# Patient Record
Sex: Male | Born: 1965 | Race: White | Hispanic: No | Marital: Single | Smoking: Former smoker
Health system: Southern US, Community
[De-identification: ages and names within clinical notes are randomized; demographics above are authoritative.]

## PROBLEM LIST (undated history)

## (undated) DIAGNOSIS — E119 Type 2 diabetes mellitus without complications: Secondary | ICD-10-CM

## (undated) DIAGNOSIS — M549 Dorsalgia, unspecified: Secondary | ICD-10-CM

## (undated) DIAGNOSIS — I10 Essential (primary) hypertension: Secondary | ICD-10-CM

## (undated) HISTORY — DX: Dorsalgia, unspecified: M54.9

---

## 2014-04-05 DIAGNOSIS — S2249XA Multiple fractures of ribs, unspecified side, initial encounter for closed fracture: Secondary | ICD-10-CM | POA: Insufficient documentation

## 2014-04-05 DIAGNOSIS — S36113A Laceration of liver, unspecified degree, initial encounter: Secondary | ICD-10-CM | POA: Insufficient documentation

## 2014-04-05 DIAGNOSIS — J939 Pneumothorax, unspecified: Secondary | ICD-10-CM | POA: Insufficient documentation

## 2014-04-05 DIAGNOSIS — F1010 Alcohol abuse, uncomplicated: Secondary | ICD-10-CM | POA: Insufficient documentation

## 2014-04-05 HISTORY — DX: Multiple fractures of ribs, unspecified side, initial encounter for closed fracture: S22.49XA

## 2021-06-27 ENCOUNTER — Ambulatory Visit
Admission: RE | Admit: 2021-06-27 | Discharge: 2021-06-27 | Disposition: A | Discharge status: Home / Self Care | Payer: Self-pay | Source: Ambulatory Visit | Attending: Counselor | Admitting: Counselor

## 2021-06-27 ENCOUNTER — Other Ambulatory Visit: Payer: Self-pay | Admitting: Counselor

## 2021-06-27 DIAGNOSIS — X58XXXA Exposure to other specified factors, initial encounter: Secondary | ICD-10-CM | POA: Insufficient documentation

## 2021-06-27 DIAGNOSIS — S6991XA Unspecified injury of right wrist, hand and finger(s), initial encounter: Secondary | ICD-10-CM

## 2022-01-20 ENCOUNTER — Emergency Department: Payer: BC Managed Care – PPO

## 2022-01-20 ENCOUNTER — Emergency Department
Admission: EM | Admit: 2022-01-20 | Discharge: 2022-01-20 | Disposition: A | Discharge status: Home / Self Care | Payer: BC Managed Care – PPO | Attending: Emergency Medicine | Admitting: Emergency Medicine

## 2022-01-20 ENCOUNTER — Other Ambulatory Visit: Payer: Self-pay

## 2022-01-20 ENCOUNTER — Encounter: Payer: Self-pay | Admitting: Intensive Care

## 2022-01-20 DIAGNOSIS — R0781 Pleurodynia: Secondary | ICD-10-CM | POA: Diagnosis not present

## 2022-01-20 DIAGNOSIS — M546 Pain in thoracic spine: Secondary | ICD-10-CM | POA: Insufficient documentation

## 2022-01-20 DIAGNOSIS — I2510 Atherosclerotic heart disease of native coronary artery without angina pectoris: Secondary | ICD-10-CM | POA: Diagnosis not present

## 2022-01-20 DIAGNOSIS — R739 Hyperglycemia, unspecified: Secondary | ICD-10-CM | POA: Insufficient documentation

## 2022-01-20 DIAGNOSIS — E1165 Type 2 diabetes mellitus with hyperglycemia: Secondary | ICD-10-CM | POA: Diagnosis not present

## 2022-01-20 DIAGNOSIS — I10 Essential (primary) hypertension: Secondary | ICD-10-CM | POA: Diagnosis not present

## 2022-01-20 DIAGNOSIS — Z79899 Other long term (current) drug therapy: Secondary | ICD-10-CM | POA: Insufficient documentation

## 2022-01-20 DIAGNOSIS — I700 Atherosclerosis of aorta: Secondary | ICD-10-CM | POA: Diagnosis not present

## 2022-01-20 DIAGNOSIS — M549 Dorsalgia, unspecified: Secondary | ICD-10-CM | POA: Diagnosis not present

## 2022-01-20 DIAGNOSIS — K429 Umbilical hernia without obstruction or gangrene: Secondary | ICD-10-CM | POA: Diagnosis not present

## 2022-01-20 DIAGNOSIS — K6289 Other specified diseases of anus and rectum: Secondary | ICD-10-CM | POA: Diagnosis not present

## 2022-01-20 DIAGNOSIS — R079 Chest pain, unspecified: Secondary | ICD-10-CM | POA: Diagnosis not present

## 2022-01-20 DIAGNOSIS — K6389 Other specified diseases of intestine: Secondary | ICD-10-CM | POA: Diagnosis not present

## 2022-01-20 DIAGNOSIS — R0789 Other chest pain: Secondary | ICD-10-CM | POA: Diagnosis not present

## 2022-01-20 LAB — BASIC METABOLIC PANEL
Anion gap: 12 (ref 5–15)
BUN: 8 mg/dL (ref 6–20)
CO2: 25 mmol/L (ref 22–32)
Calcium: 9.1 mg/dL (ref 8.9–10.3)
Chloride: 98 mmol/L (ref 98–111)
Creatinine, Ser: 0.88 mg/dL (ref 0.61–1.24)
GFR, Estimated: >60 mL/min (ref >60)
Glucose, Bld: 311 mg/dL — ABNORMAL HIGH (ref 70–99)
Potassium: 4 mmol/L (ref 3.5–5.1)
Sodium: 135 mmol/L (ref 135–145)

## 2022-01-20 LAB — CBC
HCT: 50.9 % (ref 39.0–52.0)
Hemoglobin: 17.2 g/dL — ABNORMAL HIGH (ref 13.0–17.0)
MCH: 32.1 pg (ref 26.0–34.0)
MCHC: 33.8 g/dL (ref 30.0–36.0)
MCV: 95 fL (ref 80.0–100.0)
Platelets: 255 10*3/uL (ref 150–400)
RBC: 5.36 MIL/uL (ref 4.22–5.81)
RDW: 11.9 % (ref 11.5–15.5)
WBC: 6.2 10*3/uL (ref 4.0–10.5)
nRBC: 0 % (ref 0.0–0.2)

## 2022-01-20 LAB — CBC WITH DIFFERENTIAL/PLATELET
Abs Immature Granulocytes: 0.02 10*3/uL (ref 0.00–0.07)
Basophils Absolute: 0.1 10*3/uL (ref 0.0–0.1)
Basophils Relative: 1 %
Eosinophils Absolute: 0.1 10*3/uL (ref 0.0–0.5)
Eosinophils Relative: 2 %
HCT: 49.6 % (ref 39.0–52.0)
Hemoglobin: 17 g/dL (ref 13.0–17.0)
Immature Granulocytes: 0 %
Lymphocytes Relative: 20 %
Lymphs Abs: 1.3 10*3/uL (ref 0.7–4.0)
MCH: 32.6 pg (ref 26.0–34.0)
MCHC: 34.3 g/dL (ref 30.0–36.0)
MCV: 95.2 fL (ref 80.0–100.0)
Monocytes Absolute: 0.8 10*3/uL (ref 0.1–1.0)
Monocytes Relative: 13 %
Neutro Abs: 4.1 10*3/uL (ref 1.7–7.7)
Neutrophils Relative %: 64 %
Platelets: 256 10*3/uL (ref 150–400)
RBC: 5.21 MIL/uL (ref 4.22–5.81)
RDW: 11.9 % (ref 11.5–15.5)
WBC: 6.4 10*3/uL (ref 4.0–10.5)
nRBC: 0 % (ref 0.0–0.2)

## 2022-01-20 LAB — HEPATIC FUNCTION PANEL
ALT: 35 U/L (ref 0–44)
AST: 27 U/L (ref 15–41)
Albumin: 4.2 g/dL (ref 3.5–5.0)
Alkaline Phosphatase: 55 U/L (ref 38–126)
Bilirubin, Direct: 0.2 mg/dL (ref 0.0–0.2)
Indirect Bilirubin: 0.9 mg/dL (ref 0.3–0.9)
Total Bilirubin: 1.1 mg/dL (ref 0.3–1.2)
Total Protein: 7.9 g/dL (ref 6.5–8.1)

## 2022-01-20 LAB — TROPONIN I (HIGH SENSITIVITY)
Troponin I (High Sensitivity): 6 ng/L (ref <18)
Troponin I (High Sensitivity): 7 ng/L (ref <18)

## 2022-01-20 LAB — LIPASE, BLOOD: Lipase: 53 U/L — ABNORMAL HIGH (ref 11–51)

## 2022-01-20 LAB — PROTIME-INR
INR: 1 (ref 0.8–1.2)
Prothrombin Time: 12.9 seconds (ref 11.4–15.2)

## 2022-01-20 LAB — CBG MONITORING, ED: Glucose-Capillary: 214 mg/dL — ABNORMAL HIGH (ref 70–99)

## 2022-01-20 MED ORDER — LABETALOL HCL 5 MG/ML IV SOLN
10.0000 mg | Freq: Once | INTRAVENOUS | Status: AC
Start: 2022-01-20 — End: 2022-01-20
  Administered 2022-01-20: 10 mg via INTRAVENOUS
  Filled 2022-01-20: qty 4

## 2022-01-20 MED ORDER — IOHEXOL 350 MG/ML SOLN
75.0000 mL | Freq: Once | INTRAVENOUS | Status: AC | PRN
Start: 2022-01-20 — End: 2022-01-20
  Administered 2022-01-20: 100 mL via INTRAVENOUS

## 2022-01-20 NOTE — ED Notes (Signed)
Pt states that he has been having back pain for the past couple of days that radiated to the right side of his chest. Pt describes pain as a constant stabbing pain in his chest. Pt does states it hurts when he takes a deep breath. Pt denies any significant cardiac hx. Pt is talking in complete sentences. Chest expansion symmetrical and respirations unlabored. Pt denies any SOB.

## 2022-01-20 NOTE — ED Notes (Signed)
Pt verbalized understanding of discharge instructions and follow-up care instructions. Pt advised if symptoms worsen to return to ED.  

## 2022-01-20 NOTE — Discharge Instructions (Addendum)
Your blood pressure and your blood sugar were both higher here.  The high blood pressure may be due to whitecoat syndrome.  The high blood sugar probably is not.  It is high enough that we can say that you have diabetes.  We need to work on managing this.  The first steps are diet and exercise.  I have included some information on these.  I would like you to follow-up with primary care within the week to continue to monitor your blood pressure and blood sugar.  Please continue to take and write down your blood pressures at home.  Take this information to your primary care doctor.  If you cannot get into see your assigned primary care doctor within the next week or so I would try a walk-in clinic.  [facility] has 1 and there are several others in the area.  They can begin working on your problems.  They can give you more information about diet and exercise for diabetic treatment.  The good news is that if we can get you to lose some weight likely the high blood sugars will resolve and your blood pressure will also go down.  We did not find anything in your back with the CT scan even with the bone windows.  Also the rest of the blood work looked okay.  You can try some Maalox or Mylanta to see if it helps with the belly.  Please do not hesitate to return for worse pain or high blood sugar or feeling sicker.  Follow-up with primary care as I mention.  Remember that if the area where your skin is tender to touch develops any blisters I would like you to get seen at once.  You can return here for that.  We can then give you something for shingles.  I would do it now except for that you have had that tenderness to light touch on your skin for a week and usually shingles would have shown up with some rash by this time.

## 2022-01-20 NOTE — ED Triage Notes (Signed)
Patient c/o mid back pain in spine and now radiating around to his right chest. C/o lightheadedness and burning pain after coughing. C/o burning pain on left side with no rash present but also reports feeling it slight on right side. A&O x4

## 2022-01-20 NOTE — ED Provider Notes (Signed)
[facility] Provider Note    Event Date/Time   First MD Initiated Contact with Patient 01/20/22 1624     (approximate)   History   Back Pain and Chest Pain   HPI  Colin Brooks is a 56 y.o. male who reports onset of mid back pain about a week ago it has gradually gotten worse without moving from its place in the mid spine but now its radiating through the chest into the front of the chest mostly on the right side lower rib area.  Pain is quite bad is worse with movement.  There is no distal numbness or weakness.  Patient with blood pressure of 220/108 pulse rate of 110 and a glucose over 300.  These are all new problems per his old records and his history.      Physical Exam   Triage Vital Signs: ED Triage Vitals  Enc Vitals Group     BP 01/20/22 1453 (!) 220/108     Pulse Rate 01/20/22 1453 (!) 110     Resp 01/20/22 1453 20     Temp 01/20/22 1453 99.1 F (37.3 C)     Temp Source 01/20/22 1453 Oral     SpO2 01/20/22 1453 98 %     Weight 01/20/22 1454 220 lb (99.8 kg)     Height 01/20/22 1454 5\' 9"  (1.753 m)     Head Circumference --      Peak Flow --      Pain Score 01/20/22 1453 9     Pain Loc --      Pain Edu? --      Excl. in [place]? --     Most recent vital signs: Vitals:   01/20/22 1730 01/20/22 1915  BP: (!) 170/99 (!) 165/99  Pulse: 75 76  Resp: 17 14  Temp:    SpO2: 97% 97%     General: Awake, no distress.  CV:  Good peripheral perfusion.  Heart regular rate and rhythm no audible murmurs Resp:  Normal effort.  Lungs are clear Back: Nontender to palpation percussion ribs nontender to palpation percussion or compression Abd:  No distention.  Soft bowel sounds are positive no bruits are present I do not feel any palpable masses. Extremities: No edema   ED Results / Procedures / Treatments   Labs (all labs ordered are listed, but only abnormal results are displayed) Labs Reviewed  BASIC METABOLIC PANEL - Abnormal; Notable for  the following components:      Result Value   Glucose, Bld 311 (*)    All other components within normal limits  CBC - Abnormal; Notable for the following components:   Hemoglobin 17.2 (*)    All other components within normal limits  LIPASE, BLOOD - Abnormal; Notable for the following components:   Lipase 53 (*)    All other components within normal limits  CBG MONITORING, ED - Abnormal; Notable for the following components:   Glucose-Capillary 214 (*)    All other components within normal limits  PROTIME-INR  HEPATIC FUNCTION PANEL  CBC WITH DIFFERENTIAL/PLATELET  TROPONIN I (HIGH SENSITIVITY)  TROPONIN I (HIGH SENSITIVITY)     EKG  EKG read and interpreted by me shows sinus tachycardia at a rate of 107 left axis poor R wave progression 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   RADIOLOGY Chest x-ray be read by radiology pictures reviewed by me showed no acute disease   PROCEDURES:  Critical Care performed:   Procedures  MEDICATIONS ORDERED IN ED: Medications  labetalol (NORMODYNE) injection 10 mg (10 mg Intravenous Given 01/20/22 1856)  iohexol (OMNIPAQUE) 350 MG/ML injection 75 mL (100 mLs Intravenous Contrast Given 01/20/22 1755)     IMPRESSION / MDM / ASSESSMENT AND PLAN / ED COURSE  I reviewed the triage vital signs and the nursing notes. Patient's blood pressure comes down spontaneously from 220/107 down to 140 systolic and back up to 170/99 without any medicines.  However did go back up so we will give the Lopressor as ordered.  We will await the CT report.  Patient's blood sugar is also high.  Patient CT scans including bony window reconstruction do not show any pathology in the lungs or the aorta or the spine.  Not sure what is causing the patient's pain.  He does have an area on the left side at the belt line well below where the other pain is where the skin is tender to light touch.  This makes me think of shingles but this light touch tenderness is been  going on for a week at least.  Normally in my experience shingles would have developed a rash already.  I have warned him to return if he develops any kind of blisters or rash there so that we can begin treatment for shingles rapidly.  There is no sign of any pulmonary embolus or aortic dissection gallbladder problems or pneumonia or anything else that might cause pain like this.  I am uncertain what is causing the pain but currently it is not severe is improved.  I will have the patient follow-up for reevaluation of his blood pressure which he says is probably whitecoat hypertension.  He reports at home he takes his blood pressure is usually in the 130s over 70s or 80s.  His blood sugar however is another matter.  It is still high even when his blood pressure comes down.  It is 241 now.  Patient says a family members have had bad experiences with metformin he really does not want to start with that.  I think we can start with diet and exercise and see what we can achieve with that.  I will have him follow-up closely with primary care.  He will return if he has any further problems.  I did consider admitting the patient but there does not seem to be anything to admit him for at this point. FINAL CLINICAL IMPRESSION(S) / ED DIAGNOSES   Final diagnoses:  Acute midline thoracic back pain  Hypertension, unspecified type  High blood sugar     Rx / DC Orders   ED Discharge Orders     None        Note:  This document was prepared using Dragon voice recognition software and may include unintentional dictation errors.   Arnaldo Natal, MD 01/20/22 [id number]

## 2022-01-25 DIAGNOSIS — I10 Essential (primary) hypertension: Secondary | ICD-10-CM | POA: Diagnosis not present

## 2022-01-25 DIAGNOSIS — H6980 Other specified disorders of Eustachian tube, unspecified ear: Secondary | ICD-10-CM | POA: Diagnosis not present

## 2022-01-25 DIAGNOSIS — M62830 Muscle spasm of back: Secondary | ICD-10-CM | POA: Diagnosis not present

## 2022-01-25 DIAGNOSIS — E119 Type 2 diabetes mellitus without complications: Secondary | ICD-10-CM | POA: Diagnosis not present

## 2022-01-27 ENCOUNTER — Other Ambulatory Visit: Payer: Self-pay

## 2022-01-27 ENCOUNTER — Emergency Department: Payer: BC Managed Care – PPO

## 2022-01-27 ENCOUNTER — Encounter: Payer: Self-pay | Admitting: Emergency Medicine

## 2022-01-27 ENCOUNTER — Emergency Department
Admission: EM | Admit: 2022-01-27 | Discharge: 2022-01-27 | Disposition: A | Discharge status: Home / Self Care | Payer: BC Managed Care – PPO | Attending: Emergency Medicine | Admitting: Emergency Medicine

## 2022-01-27 DIAGNOSIS — R0781 Pleurodynia: Secondary | ICD-10-CM | POA: Diagnosis not present

## 2022-01-27 DIAGNOSIS — I10 Essential (primary) hypertension: Secondary | ICD-10-CM | POA: Diagnosis not present

## 2022-01-27 DIAGNOSIS — R079 Chest pain, unspecified: Secondary | ICD-10-CM

## 2022-01-27 DIAGNOSIS — R059 Cough, unspecified: Secondary | ICD-10-CM | POA: Diagnosis not present

## 2022-01-27 DIAGNOSIS — R42 Dizziness and giddiness: Secondary | ICD-10-CM | POA: Diagnosis not present

## 2022-01-27 DIAGNOSIS — E119 Type 2 diabetes mellitus without complications: Secondary | ICD-10-CM | POA: Diagnosis not present

## 2022-01-27 HISTORY — DX: Type 2 diabetes mellitus without complications: E11.9

## 2022-01-27 HISTORY — DX: Essential (primary) hypertension: I10

## 2022-01-27 LAB — BASIC METABOLIC PANEL
Anion gap: 13 (ref 5–15)
BUN: 10 mg/dL (ref 6–20)
CO2: 26 mmol/L (ref 22–32)
Calcium: 9.5 mg/dL (ref 8.9–10.3)
Chloride: 96 mmol/L — ABNORMAL LOW (ref 98–111)
Creatinine, Ser: 0.95 mg/dL (ref 0.61–1.24)
GFR, Estimated: >60 mL/min (ref >60)
Glucose, Bld: 292 mg/dL — ABNORMAL HIGH (ref 70–99)
Potassium: 3.8 mmol/L (ref 3.5–5.1)
Sodium: 135 mmol/L (ref 135–145)

## 2022-01-27 LAB — CBC
HCT: 52.6 % — ABNORMAL HIGH (ref 39.0–52.0)
Hemoglobin: 18.1 g/dL — ABNORMAL HIGH (ref 13.0–17.0)
MCH: 31.8 pg (ref 26.0–34.0)
MCHC: 34.4 g/dL (ref 30.0–36.0)
MCV: 92.4 fL (ref 80.0–100.0)
Platelets: 250 10*3/uL (ref 150–400)
RBC: 5.69 MIL/uL (ref 4.22–5.81)
RDW: 11.9 % (ref 11.5–15.5)
WBC: 7.7 10*3/uL (ref 4.0–10.5)
nRBC: 0 % (ref 0.0–0.2)

## 2022-01-27 LAB — TROPONIN I (HIGH SENSITIVITY)
Troponin I (High Sensitivity): 6 ng/L (ref <18)
Troponin I (High Sensitivity): 5 ng/L (ref <18)

## 2022-01-27 MED ORDER — PANTOPRAZOLE SODIUM 40 MG IV SOLR
40.0000 mg | Freq: Once | INTRAVENOUS | Status: AC
Start: 2022-01-27 — End: 2022-01-27
  Administered 2022-01-27: 40 mg via INTRAVENOUS
  Filled 2022-01-27: qty 10

## 2022-01-27 MED ORDER — FAMOTIDINE 20 MG PO TABS: 20.0000 mg | ORAL_TABLET | Freq: Two times a day (BID) | ORAL | 0 refills | Status: DC

## 2022-01-27 MED ORDER — NAPROXEN 500 MG PO TABS: 500.0000 mg | ORAL_TABLET | Freq: Two times a day (BID) | ORAL | 0 refills | Status: DC

## 2022-01-27 MED ORDER — KETOROLAC TROMETHAMINE 30 MG/ML IJ SOLN
15.0000 mg | INTRAMUSCULAR | Status: AC
  Administered 2022-01-27: 15 mg via INTRAVENOUS
  Filled 2022-01-27: qty 1

## 2022-01-27 MED ORDER — PREDNISONE 20 MG PO TABS: 40.0000 mg | ORAL_TABLET | Freq: Every day | ORAL | 0 refills | Status: AC

## 2022-01-27 MED ORDER — METHYLPREDNISOLONE SODIUM SUCC 125 MG IJ SOLR
125.0000 mg | INTRAMUSCULAR | Status: AC
  Administered 2022-01-27: 125 mg via INTRAVENOUS
  Filled 2022-01-27: qty 2

## 2022-01-27 NOTE — ED Triage Notes (Signed)
Pt in via POV, reports ongoing mid upper back pain with radiation into right chest w/ associated dizziness, lightheadedness.  States this has been going on x a couple of weeks, evaluated before, exams unremarkable.  Ambulatory to triage, NAD noted at this time. ?

## 2022-01-27 NOTE — ED Notes (Signed)
Delay in d/c d/t other emergencies in department.  ?

## 2022-01-27 NOTE — ED Notes (Signed)
C/o back pain, radiating through to chest, alert, NAD, calm, interactive, resps e/u, speaking in clear complete sentences. Reports associated dizziness/ light headedness PTA. No TTP. VSS.  ?

## 2022-01-27 NOTE — ED Provider Notes (Signed)
? ?[facility] ?Provider Note ? ? ? Event Date/Time  ? First MD Initiated Contact with Patient 01/27/22 1515   ?  (approximate) ? ? ?History  ? ?Chest Pain ? ? ?HPI ? ?Colin Brooks is a 56 y.o. male with a history of hypertension and diabetes just recently diagnosed who comes ED complaining of right upper back pain that radiates through to the right anterior chest.  Worse with cough and deep breathing, associated with episodes of lightheadedness.  Not exertional.  No aggravating or alleviating factors.  Not positional.  Ongoing for the past 7 to 10 days.  He was seen in the ED a week ago, had labs including serial troponins and a CT angiogram of the chest abdomen pelvis which were all unremarkable.  Reports this morning pain felt even more severe so he came back to the ED. ? ?No neck pain or headache.  No vision changes, no paresthesias or weakness. ?  ? ? ?Physical Exam  ? ?Triage Vital Signs: ?ED Triage Vitals  ?Enc Vitals Group  ?   BP 01/27/22 1249 (!) 183/107  ?   Pulse Rate 01/27/22 1249 94  ?   Resp 01/27/22 1249 16  ?   Temp 01/27/22 1249 99 ?F (37.2 ?C)  ?   Temp Source 01/27/22 1249 Oral  ?   SpO2 01/27/22 1249 94 %  ?   Weight 01/27/22 1246 220 lb (99.8 kg)  ?   Height 01/27/22 1246 5\' 9"  (1.753 m)  ?   Head Circumference --   ?   Peak Flow --   ?   Pain Score 01/27/22 1246 8  ?   Pain Loc --   ?   Pain Edu? --   ?   Excl. in [place]? --   ? ? ?Most recent vital signs: ?Vitals:  ? 01/27/22 1535 01/27/22 1545  ?BP:  (!) 144/100  ?Pulse: 89 93  ?Resp: 18 17  ?Temp:    ?SpO2: 97% 95%  ? ? ? ?General: Awake, no distress.  ?CV:  Good peripheral perfusion.  Regular rate and rhythm, normal peripheral pulses ?Resp:  Normal effort.  Clear to auscultation bilaterally, no wheezing ?Abd:  No distention.  Soft and nontender ?Other:  No lower extremity edema or calf tenderness.  Symmetric calf circumference.  No rash ? ? ?ED Results / Procedures / Treatments  ? ?Labs ?(all labs ordered are listed, but  only abnormal results are displayed) ?Labs Reviewed  ?BASIC METABOLIC PANEL - Abnormal; Notable for the following components:  ?    Result Value  ? Chloride 96 (*)   ? Glucose, Bld 292 (*)   ? All other components within normal limits  ?CBC - Abnormal; Notable for the following components:  ? Hemoglobin 18.1 (*)   ? HCT 52.6 (*)   ? All other components within normal limits  ?TROPONIN I (HIGH SENSITIVITY)  ?TROPONIN I (HIGH SENSITIVITY)  ? ? ? ?EKG ? ?Interpreted by me ?Normal sinus rhythm rate of 89.  Normal axis, normal intervals.  Poor R wave progression.  Normal ST segments and T waves. ? ?Repeat EKG interpreted by me, sinus rhythm, rate of 84, no interval change. ? ? ?RADIOLOGY ?Chest x-ray viewed and interpreted by me, appears unremarkable.  Radiology report reviewed. ? ? ? ?PROCEDURES: ? ?Critical Care performed: No ? ?Procedures ? ? ?MEDICATIONS ORDERED IN ED: ?Medications  ?ketorolac (TORADOL) 30 MG/ML injection 15 mg (15 mg Intravenous Given 01/27/22 1555)  ?methylPREDNISolone sodium  succinate (SOLU-MEDROL) 125 mg/2 mL injection 125 mg (125 mg Intravenous Given 01/27/22 1555)  ?pantoprazole (PROTONIX) injection 40 mg (40 mg Intravenous Given 01/27/22 1556)  ? ? ? ?IMPRESSION / MDM / ASSESSMENT AND PLAN / ED COURSE  ?I reviewed the triage vital signs and the nursing notes. ?             ?               ? ?Differential diagnosis includes, but is not limited to, non-STEMI, pulmonary embolism, pleurisy, shingles, radiculopathy, chest wall strain ? ?**The patient is on the cardiac monitor to evaluate for evidence of arrhythmia and/or significant heart rate changes.**} ? ?Patient presents with pleuritic chest pain.  Vital signs are normal, no tachycardia tachypnea or hypoxia.  I reviewed the images from his CT angiogram from a week ago when he was seen for similar symptoms.  No apparent PE or airspace infiltrate.  I discussed that study with radiology who agrees that they do not identify any pulmonary emboli, but also  note that contrast timing does not visualize the subsegmental branches. ? ?After receiving Toradol Protonix and Solu-Medrol, patient reports that his pain has essentially resolved.  He denies any current shortness of breath or pleuritic symptoms.  I offered repeat CT angiogram to rule out PE as well as hospitalization for further cardiac work-up (even though symptoms are very atypical for ACS), but patient declines and wishes to monitor symptoms at home and follow-up outpatient.  I think this is reasonable given his reassuring work-up and improvement in symptoms. ?  ? ? ?FINAL CLINICAL IMPRESSION(S) / ED DIAGNOSES  ? ?Final diagnoses:  ?Nonspecific chest pain  ? ? ? ?Rx / DC Orders  ? ?ED Discharge Orders   ? ?      Ordered  ?  naproxen (NAPROSYN) 500 MG tablet  2 times daily with meals       ? 01/27/22 1742  ?  predniSONE (DELTASONE) 20 MG tablet  Daily with breakfast       ? 01/27/22 1742  ?  famotidine (PEPCID) 20 MG tablet  2 times daily       ? 01/27/22 1742  ? ?  ?  ? ?  ? ? ? ?Note:  This document was prepared using Dragon voice recognition software and may include unintentional dictation errors. ?  ?Carrie Mew, MD ?01/27/22 1750 ? ?

## 2022-01-27 NOTE — ED Notes (Signed)
EDP at BS 

## 2022-01-30 ENCOUNTER — Telehealth: Payer: Self-pay

## 2022-01-30 NOTE — Telephone Encounter (Signed)
Spoke with patient to let him know that we don't have anything available for NP that we are out till June 2023 but if we have a cancellation will give him a call and he said that he would just wait till his appt on 02-17-2022

## 2022-01-30 NOTE — Telephone Encounter (Signed)
Copied from [place] [id number]. Topic: General - Other >> Jan 30, 2022 12:51 PM Alanda Slim E wrote: Reason for CRM: Pt has been going to ER for chest pain / pt was advised that he has lung inflamation and BP issues and they advised pt to follow up with PCP office this week/ Colin Brooks has a NP appt on 3.24.23 with Dr. Mitzi Hansen but is asking if there is anyway he can be seen sooner / please advise

## 2022-02-17 ENCOUNTER — Other Ambulatory Visit: Payer: Self-pay

## 2022-02-17 ENCOUNTER — Ambulatory Visit (INDEPENDENT_AMBULATORY_CARE_PROVIDER_SITE_OTHER): Payer: BC Managed Care – PPO | Admitting: Internal Medicine

## 2022-02-17 ENCOUNTER — Encounter: Payer: Self-pay | Admitting: Internal Medicine

## 2022-02-17 VITALS — BP 140/82 | HR 99 | Temp 98.1°F | Resp 16 | Ht 69.0 in | Wt 217.7 lb

## 2022-02-17 DIAGNOSIS — Z23 Encounter for immunization: Secondary | ICD-10-CM | POA: Diagnosis not present

## 2022-02-17 DIAGNOSIS — Z13220 Encounter for screening for lipoid disorders: Secondary | ICD-10-CM | POA: Diagnosis not present

## 2022-02-17 DIAGNOSIS — R109 Unspecified abdominal pain: Secondary | ICD-10-CM | POA: Diagnosis not present

## 2022-02-17 DIAGNOSIS — R1013 Epigastric pain: Secondary | ICD-10-CM

## 2022-02-17 DIAGNOSIS — R748 Abnormal levels of other serum enzymes: Secondary | ICD-10-CM

## 2022-02-17 DIAGNOSIS — Z1159 Encounter for screening for other viral diseases: Secondary | ICD-10-CM

## 2022-02-17 DIAGNOSIS — Z114 Encounter for screening for human immunodeficiency virus [HIV]: Secondary | ICD-10-CM

## 2022-02-17 DIAGNOSIS — I10 Essential (primary) hypertension: Secondary | ICD-10-CM

## 2022-02-17 DIAGNOSIS — E1165 Type 2 diabetes mellitus with hyperglycemia: Secondary | ICD-10-CM

## 2022-02-17 LAB — POCT URINALYSIS DIPSTICK
Bilirubin, UA: NEGATIVE
Blood, UA: NEGATIVE
Glucose, UA: POSITIVE — AB
Ketones, UA: NEGATIVE
Leukocytes, UA: NEGATIVE
Nitrite, UA: NEGATIVE
Odor: NORMAL
Protein, UA: NEGATIVE
Spec Grav, UA: 1.015 (ref 1.010–1.025)
Urobilinogen, UA: 0.2 E.U./dL
pH, UA: 5.5 (ref 5.0–8.0)

## 2022-02-17 MED ORDER — METFORMIN HCL 500 MG PO TABS: 500.0000 mg | ORAL_TABLET | Freq: Every morning | ORAL | 3 refills | Status: DC

## 2022-02-17 NOTE — Assessment & Plan Note (Signed)
Due for labs, A1c, microalbumin, etc. Continue Metformin 500 mg, resistant to increasing dose. Discussed extended release version. Referral to Ophthalmology, foot exam at follow up.  ?

## 2022-02-17 NOTE — Progress Notes (Signed)
? ?New Patient Office Visit ? ?Subjective:  ?Patient ID: Colin Brooks, male    DOB: 1965-12-23  Age: 56 y.o. MRN: [id number] ? ?CC:  ?Chief Complaint  ?Patient presents with  ? Establish Care  ? Back Pain  ?  In center of back w/ RLQ pain  ? ? ?HPI ?Colin Brooks presents as a new patient. Was recently seen in the ER earlier in March for pleuritic chest pain. Chest x-ray at the time was negative. CTA 01/20/22 negative for clot. Lipase at the time slightly elevated at 53. He was given Toradol, Protonix and Solu-Medrol and his pain lessened and he was discharged.  Still having thoracic back pain radiating into chest. Has been going on about 2 months on and off. Sitting for too long seems to make it worse, not associated with eating or exertion. No shortness of breath or chest pain. NSAIDs help. Getting better over time. ? ?Also having some left sided flank pain. Very point tender going on for years. No urinary issues. No dysuria or blood in the urine. ? ?Diabetes, Type 2: ?-Last A1c uncertain ?-Medications: Metformin 500 mg once daily - started in the ER a few weeks ago but hesitant to have to increase dose  ?-Patient is compliant with the above medications and reports no side effects.  ?-Checking BG at home: post-prandial 220-370 ?-Eye exam: Referral placed, due ?-Foot exam: Due ?-Microalbumin: Due  ?-Statin: No ?-PNA vaccine: No ?-Denies symptoms of hypoglycemia, polyuria, polydipsia, numbness extremities, foot ulcers/trauma.  ? ?Hypertension: ?-Medications: none, had a few high blood pressures in the ER ?-Checking BP at home (average): 140/85 ?-Denies any SOB, CP, vision changes, LE edema or symptoms of hypotension ? ?Past Medical History:  ?Diagnosis Date  ? Diabetes mellitus without complication (HCC)   ? Hypertension   ? ? ?History reviewed. No pertinent surgical history. ? ?Family History  ?Problem Relation Age of Onset  ? Cancer Mother   ?     breast  ? Diabetes Mother   ? Heart disease Father   ? ? ?Social  History  ? ?Socioeconomic History  ? Marital status: Single  ?  Spouse name: Not on file  ? Number of children: Not on file  ? Years of education: Not on file  ? Highest education level: Not on file  ?Occupational History  ? Not on file  ?Tobacco Use  ? Smoking status: Former  ?  Types: Cigarettes  ?  Quit date: 12/28/2018  ?  Years since quitting: 3.1  ? Smokeless tobacco: Never  ?Vaping Use  ? Vaping Use: Never used  ?Substance and Sexual Activity  ? Alcohol use: Yes  ?  Alcohol/week: 12.0 standard drinks  ?  Types: 12 Cans of beer per week  ?  Comment: per week  ? Drug use: Yes  ?  Types: Marijuana  ? Sexual activity: Yes  ?Other Topics Concern  ? Not on file  ?Social History Narrative  ? Not on file  ? ?Social Determinants of Health  ? ?Financial Resource Strain: Not on file  ?Food Insecurity: Not on file  ?Transportation Needs: Not on file  ?Physical Activity: Not on file  ?Stress: Not on file  ?Social Connections: Not on file  ?Intimate Partner Violence: Not on file  ? ? ?ROS ?Review of Systems  ?Constitutional:  Negative for chills and fever.  ?Eyes:  Negative for visual disturbance.  ?Respiratory:  Negative for cough and shortness of breath.   ?Cardiovascular:  Negative for chest pain.  ?  Gastrointestinal:  Negative for abdominal pain, nausea and vomiting.  ?Genitourinary:  Positive for flank pain. Negative for dysuria, frequency, hematuria and urgency.  ?Musculoskeletal:  Positive for back pain.  ? ?Objective:  ? ?Today's Vitals: BP 140/82   Pulse 99   Temp 98.1 ?F (36.7 ?C)   Resp 16   Ht  (1.753 m)   Wt 217 lb 11.2 oz (98.7 kg)   SpO2 97%   BMI 32.15 kg/m?  ? ?Physical Exam ?Constitutional:   ?   Appearance: Normal appearance.  ?HENT:  ?   Head: Normocephalic and atraumatic.  ?   Mouth/Throat:  ?   Mouth: Mucous membranes are moist.  ?   Pharynx: Oropharynx is clear.  ?Eyes:  ?   Conjunctiva/sclera: Conjunctivae normal.  ?Cardiovascular:  ?   Rate and Rhythm: Normal rate and regular rhythm.   ?Pulmonary:  ?   Effort: Pulmonary effort is normal.  ?   Breath sounds: Normal breath sounds.  ?Abdominal:  ?   General: Bowel sounds are normal. There is no distension.  ?   Palpations: Abdomen is soft.  ?   Tenderness: There is abdominal tenderness. There is no right CVA tenderness, left CVA tenderness, guarding or rebound.  ?   Comments: Epigastric pain to palpation   ?Musculoskeletal:     ?   General: Tenderness present.  ?   Right lower leg: No edema.  ?   Left lower leg: No edema.  ?   Comments: At the level of T8, midline  ?Skin: ?   General: Skin is warm and dry.  ?Neurological:  ?   General: No focal deficit present.  ?   Mental Status: He is alert. Mental status is at baseline.  ?Psychiatric:     ?   Mood and Affect: Mood normal.     ?   Behavior: Behavior normal.  ? ? ?Assessment & Plan:  ? ?Problem List Items Addressed This Visit   ? ?Problem List Items Addressed This Visit   ? ?  ? Cardiovascular and Mediastinum  ? Hypertension  ?  Blood pressure borderline, borderline at home as well. Continue to check at home, discuss starting low dose ACEI at follow up.  ?  ?  ? Relevant Orders  ? CBC w/Diff/Platelet  ? COMPLETE METABOLIC PANEL WITH GFR  ?  ? Endocrine  ? Type 2 diabetes mellitus with hyperglycemia, without long-term current use of insulin (HCC) - Primary  ?  Due for labs, A1c, microalbumin, etc. Continue Metformin 500 mg, resistant to increasing dose. Discussed extended release version. Referral to Ophthalmology, foot exam at follow up.  ?  ?  ? Relevant Medications  ? metFORMIN (GLUCOPHAGE) 500 MG tablet  ? Other Relevant Orders  ? CBC w/Diff/Platelet  ? COMPLETE METABOLIC PANEL WITH GFR  ? HgB A1c  ? Lipid Profile  ? Urine Microalbumin w/creat. ratio  ? Ambulatory referral to Ophthalmology  ? ?Other Visit Diagnoses   ? ? Lipid screening      ? Relevant Orders  ? Lipid Profile  ? Left flank pain    - UA negative except for glucose, continue muscle relaxer and anti-inflammatories.   ? Relevant  Orders  ? POCT Urinalysis Dipstick (Completed)  ? Encounter for hepatitis C screening test for low risk patient      ? Relevant Orders  ? Hepatitis C Antibody  ? Encounter for screening for HIV      ? Relevant Orders  ? HIV  antibody (with reflex)  ? Need for diphtheria-tetanus-pertussis (Tdap) vaccine      ? Relevant Orders  ? Tdap vaccine greater than or equal to 7yo IM (Completed)  ? Epigastric pain    - recheck lipase, consider RUQ [place] if pain continues. Does drink 12 alcoholic beverages a week, lipase slightly high at the ER, back pain could be from pancreatitis.   ? Relevant Orders  ? Lipase  ? ?  ? ? ? ? ?Outpatient Encounter Medications as of 02/17/2022  ?Medication Sig  ? baclofen (LIORESAL) 10 MG tablet Take 10 mg by mouth 3 (three) times daily.  ? metFORMIN (GLUCOPHAGE) 500 MG tablet Take 500 mg by mouth every morning.  ? [DISCONTINUED] famotidine (PEPCID) 20 MG tablet Take 1 tablet (20 mg total) by mouth 2 (two) times daily for 10 days.  ? [DISCONTINUED] naproxen (NAPROSYN) 500 MG tablet Take 1 tablet (500 mg total) by mouth 2 (two) times daily with a meal.  ? ?No facility-administered encounter medications on file as of 02/17/2022.  ? ? ?Follow-up: No follow-ups on file.  ? ?Margarita Mail, DO ? ?

## 2022-02-17 NOTE — Assessment & Plan Note (Signed)
Blood pressure borderline, borderline at home as well. Continue to check at home, discuss starting low dose ACEI at follow up.  ?

## 2022-02-17 NOTE — Patient Instructions (Addendum)
It was great seeing you today! ? ?Plan discussed at today's visit: ?-Blood work ordered today, results will be uploaded to MyChart.  ?-Will check a urine today as well  ?-Referral placed to eye doctor ?-Metformin refilled, continue to work on cutting down on sugars and carbs in the diet  ?-Can continue anti-inflammatories and muscle relaxers as needed for back pain  ? ?Follow up in: 3 months  ? ?Take care and let us know if you have any questions or concerns prior to your next visit. ? ?Dr. Caralee Ates ? ?

## 2022-02-20 LAB — LIPASE: Lipase: 145 U/L — ABNORMAL HIGH (ref 7–60)

## 2022-02-20 LAB — CBC WITH DIFFERENTIAL/PLATELET
Absolute Monocytes: 476 cells/uL (ref 200–950)
Basophils Absolute: 40 cells/uL (ref 0–200)
Basophils Relative: 1 %
Eosinophils Absolute: 88 cells/uL (ref 15–500)
Eosinophils Relative: 2.2 %
HCT: 49.3 % (ref 38.5–50.0)
Hemoglobin: 16.5 g/dL (ref 13.2–17.1)
Lymphs Abs: 932 cells/uL (ref 850–3900)
MCH: 32.6 pg (ref 27.0–33.0)
MCHC: 33.5 g/dL (ref 32.0–36.0)
MCV: 97.4 fL (ref 80.0–100.0)
MPV: 9.8 fL (ref 7.5–12.5)
Monocytes Relative: 11.9 %
Neutro Abs: 2464 cells/uL (ref 1500–7800)
Neutrophils Relative %: 61.6 %
Platelets: 266 10*3/uL (ref 140–400)
RBC: 5.06 10*6/uL (ref 4.20–5.80)
RDW: 12.2 % (ref 11.0–15.0)
Total Lymphocyte: 23.3 %
WBC: 4 10*3/uL (ref 3.8–10.8)

## 2022-02-20 LAB — LIPID PANEL
Cholesterol: 269 mg/dL — ABNORMAL HIGH (ref <200)
HDL: 46 mg/dL (ref >=40)
Non-HDL Cholesterol (Calc): 223 mg/dL (calc) — ABNORMAL HIGH (ref <130)
Total CHOL/HDL Ratio: 5.8 (calc) — ABNORMAL HIGH (ref <5.0)
Triglycerides: 478 mg/dL — ABNORMAL HIGH (ref <150)

## 2022-02-20 LAB — COMPLETE METABOLIC PANEL WITH GFR
AG Ratio: 1.7 (calc) (ref 1.0–2.5)
ALT: 31 U/L (ref 9–46)
AST: 20 U/L (ref 10–35)
Albumin: 4.4 g/dL (ref 3.6–5.1)
Alkaline phosphatase (APISO): 54 U/L (ref 35–144)
BUN: 8 mg/dL (ref 7–25)
CO2: 29 mmol/L (ref 20–32)
Calcium: 9.6 mg/dL (ref 8.6–10.3)
Chloride: 101 mmol/L (ref 98–110)
Creat: 0.92 mg/dL (ref 0.70–1.30)
Globulin: 2.6 g/dL (calc) (ref 1.9–3.7)
Glucose, Bld: 331 mg/dL — ABNORMAL HIGH (ref 65–99)
Potassium: 4.4 mmol/L (ref 3.5–5.3)
Sodium: 138 mmol/L (ref 135–146)
Total Bilirubin: 0.6 mg/dL (ref 0.2–1.2)
Total Protein: 7 g/dL (ref 6.1–8.1)
eGFR: 97 mL/min/{1.73_m2} (ref >=60)

## 2022-02-20 LAB — MICROALBUMIN / CREATININE URINE RATIO
Creatinine, Urine: 48 mg/dL (ref 20–320)
Microalb Creat Ratio: 27 mcg/mg creat (ref <30)
Microalb, Ur: 1.3 mg/dL

## 2022-02-20 LAB — HEMOGLOBIN A1C
Hgb A1c MFr Bld: 9.1 % of total Hgb — ABNORMAL HIGH (ref <5.7)
Mean Plasma Glucose: 214 mg/dL
eAG (mmol/L): 11.9 mmol/L

## 2022-02-20 LAB — HEPATITIS C ANTIBODY
Hepatitis C Ab: NONREACTIVE
SIGNAL TO CUT-OFF: 0.02 (ref <1.00)

## 2022-02-20 LAB — HIV ANTIBODY (ROUTINE TESTING W REFLEX): HIV 1&2 Ab, 4th Generation: NONREACTIVE

## 2022-02-20 NOTE — Addendum Note (Signed)
Addended by: Margarita Mail on: 02/20/2022 07:59 AM ? ? Modules accepted: Orders ? ?

## 2022-02-21 ENCOUNTER — Ambulatory Visit (INDEPENDENT_AMBULATORY_CARE_PROVIDER_SITE_OTHER): Payer: BC Managed Care – PPO | Admitting: Internal Medicine

## 2022-02-21 ENCOUNTER — Encounter: Payer: Self-pay | Admitting: Internal Medicine

## 2022-02-21 VITALS — BP 142/80 | HR 93 | Temp 98.1°F | Resp 16 | Ht 69.0 in

## 2022-02-21 DIAGNOSIS — E782 Mixed hyperlipidemia: Secondary | ICD-10-CM | POA: Diagnosis not present

## 2022-02-21 DIAGNOSIS — I10 Essential (primary) hypertension: Secondary | ICD-10-CM | POA: Diagnosis not present

## 2022-02-21 DIAGNOSIS — E1165 Type 2 diabetes mellitus with hyperglycemia: Secondary | ICD-10-CM | POA: Diagnosis not present

## 2022-02-21 MED ORDER — GLIPIZIDE 5 MG PO TABS
5.0000 mg | ORAL_TABLET | Freq: Every day | ORAL | 3 refills | Status: DC
Start: 2022-02-21 — End: 2022-07-24

## 2022-02-21 NOTE — Assessment & Plan Note (Signed)
Blood pressure continues to be borderline. Patient does not want to be on medications and would rather work on lifestyle. Continue to check BP at home and plan to recheck at follow up.  ?

## 2022-02-21 NOTE — Assessment & Plan Note (Signed)
A1c 9.1%, patient very hesitant to go up on Metformin, especially with abdominal US pending. Continue Metformin 500 mg once daily, add Glipizide 5 and continue to work on lifestyle changes. Recheck in 3 months.  ?

## 2022-02-21 NOTE — Progress Notes (Signed)
? ?New Patient Office Visit ? ?Subjective:  ?Patient ID: Colin Brooks, male    DOB: 1966/04/03  Age: 56 y.o. MRN: [id number] ? ?CC:  ?Chief Complaint  ?Patient presents with  ? Diabetes  ?  Start on medication  ? ? ?HPI ?Colin Brooks here for follow up. Was recently seen in the ER earlier in March for pleuritic chest pain. Chest x-ray at the time was negative. CTA 01/20/22 negative for clot. Lipase at the time slightly elevated at 53. He was given Toradol, Protonix and Solu-Medrol and his pain lessened and he was discharged.  Still having thoracic back pain radiating into chest. Has been going on about 2 months on and off. Sitting for too long seems to make it worse, not associated with eating or exertion. No shortness of breath or chest pain. NSAIDs help. Getting better over time. Lipase recheck was elevated at 145, abdominal US pending.  ? ?Diabetes, Type 2: ?-Last A1c 02/17/22 9.1% ?-Medications: Metformin 500 mg once daily - started in the ER a few weeks ago but hesitant to have to increase dose  ?-Patient is compliant with the above medications and reports no side effects.  ?-Checking BG at home: 189 in the morning 240 post-prandial  ?-Eye exam: Referral placed ?-Foot exam: Due ?-Microalbumin: UTD ?-Statin: No ?-PNA vaccine: No ?-Denies symptoms of hypoglycemia, polyuria, polydipsia, numbness extremities, foot ulcers/trauma.  ? ?Hypertension: ?-Medications: none, had a few high blood pressures in the ER ?-Checking BP at home (average): <150/80 ?-Denies any SOB, CP, vision changes, LE edema or symptoms of hypotension ? ?HLD: ?-Medications: None ?-Last lipid panel: Lipid Panel  ?   ?Component Value Date/Time  ? CHOL 269 (H) 02/17/2022 1347  ? TRIG 478 (H) 02/17/2022 1347  ? HDL 46 02/17/2022 1347  ? CHOLHDL 5.8 (H) 02/17/2022 1347  ? LDLCALC  02/17/2022 1347  ?   Comment:  ?   . ?LDL cholesterol not calculated. Triglyceride levels ?greater than 400 mg/dL invalidate calculated LDL results. ?. ?Reference range:  <100 ?Marland Kitchen. ?Desirable range <100 mg/dL for primary prevention;   ?<70 mg/dL for patients with CHD or diabetic patients  ?with > or = 2 CHD risk factors. ?. ?LDL-C is now calculated using the Martin-Hopkins  ?calculation, which is a validated novel method providing  ?better accuracy than the Friedewald equation in the  ?estimation of LDL-C.  ?Horald PollenMartin SS et al. Lenox AhrJAMA. 5284;132(442013;310(19): 2061-2068  ?(http://education.[facility].com/faq/FAQ164) ?  ? ?The 10-year ASCVD risk score (Arnett DK, et al., 2019) is: 20.7% ?  Values used to calculate the score: ?    Age: 49 years ?    Sex: Male ?    Is Non-Hispanic African American: No ?    Diabetic: Yes ?    Tobacco smoker: No ?    Systolic Blood Pressure: 140 mmHg ?    Is BP treated: No ?    HDL Cholesterol: 46 mg/dL ?    Total Cholesterol: 269 mg/dL ? ? ? ?Past Medical History:  ?Diagnosis Date  ? Back pain   ? Diabetes mellitus without complication (HCC)   ? Hypertension   ? ? ?No past surgical history on file. ? ?Family History  ?Problem Relation Age of Onset  ? Cancer Mother   ?     breast  ? Diabetes Mother   ? Heart disease Father   ? ? ?Social History  ? ?Socioeconomic History  ? Marital status: Single  ?  Spouse name: Not on file  ? Number of children: Not  on file  ? Years of education: Not on file  ? Highest education level: Not on file  ?Occupational History  ? Not on file  ?Tobacco Use  ? Smoking status: Former  ?  Types: Cigarettes  ?  Quit date: 12/28/2018  ?  Years since quitting: 3.1  ? Smokeless tobacco: Never  ?Vaping Use  ? Vaping Use: Never used  ?Substance and Sexual Activity  ? Alcohol use: Yes  ?  Alcohol/week: 12.0 standard drinks  ?  Types: 12 Cans of beer per week  ?  Comment: per week  ? Drug use: Yes  ?  Types: Marijuana  ? Sexual activity: Yes  ?Other Topics Concern  ? Not on file  ?Social History Narrative  ? Not on file  ? ?Social Determinants of Health  ? ?Financial Resource Strain: Not on file  ?Food Insecurity: Not on file  ?Transportation Needs: Not  on file  ?Physical Activity: Not on file  ?Stress: Not on file  ?Social Connections: Not on file  ?Intimate Partner Violence: Not on file  ? ? ?ROS ?Review of Systems  ?Constitutional:  Negative for chills and fever.  ?Eyes:  Negative for visual disturbance.  ?Respiratory:  Negative for cough and shortness of breath.   ?Cardiovascular:  Negative for chest pain.  ?Gastrointestinal:  Negative for abdominal pain, nausea and vomiting.  ?Musculoskeletal:  Positive for back pain.  ? ?Objective:  ? ?Today's Vitals: BP (!) 142/80   Pulse 93   Temp 98.1 ?F (36.7 ?C)   Resp 16   Ht 5\' 9"  (1.753 m)   SpO2 98%   BMI 32.15 kg/m?  ? ?Physical Exam ?Constitutional:   ?   Appearance: Normal appearance.  ?HENT:  ?   Head: Normocephalic and atraumatic.  ?   Mouth/Throat:  ?   Mouth: Mucous membranes are moist.  ?   Pharynx: Oropharynx is clear.  ?Eyes:  ?   Conjunctiva/sclera: Conjunctivae normal.  ?Cardiovascular:  ?   Rate and Rhythm: Normal rate and regular rhythm.  ?Pulmonary:  ?   Effort: Pulmonary effort is normal.  ?   Breath sounds: Normal breath sounds.  ?Abdominal:  ?   Comments: Epigastric pain to palpation   ?Musculoskeletal:  ?   Comments: At the level of T8, midline  ?Skin: ?   General: Skin is warm and dry.  ?Neurological:  ?   General: No focal deficit present.  ?   Mental Status: He is alert. Mental status is at baseline.  ?Psychiatric:     ?   Mood and Affect: Mood normal.     ?   Behavior: Behavior normal.  ? ? ?Assessment & Plan:  ? ?Problem List Items Addressed This Visit   ? ?  ? Cardiovascular and Mediastinum  ? Hypertension  ?  Blood pressure continues to be borderline. Patient does not want to be on medications and would rather work on lifestyle. Continue to check BP at home and plan to recheck at follow up.  ?  ?  ?  ? Endocrine  ? Type 2 diabetes mellitus with hyperglycemia, without long-term current use of insulin (HCC) - Primary  ?  A1c 9.1%, patient very hesitant to go up on Metformin, especially  with abdominal pending. Continue Metformin 500 mg once daily, add Glipizide 5 and continue to work on lifestyle changes. Recheck in 3 months.  ?  ?  ? Relevant Medications  ? glipiZIDE (GLUCOTROL) 5 MG tablet  ?  ?  Other  ? Mixed hyperlipidemia  ?  Reviewed lipid panel with the patient at length, ASCVD risk high at 20.9%. Patient does not want medications, will work on lifestyle and recheck in 6-12 months.  ?  ?  ? ? ?Outpatient Encounter Medications as of 02/21/2022  ?Medication Sig  ? baclofen (LIORESAL) 10 MG tablet Take 10 mg by mouth 3 (three) times daily.  ? metFORMIN (GLUCOPHAGE) 500 MG tablet Take 1 tablet (500 mg total) by mouth every morning.  ? ?No facility-administered encounter medications on file as of 02/21/2022.  ? ? ?Follow-up: Return in about 3 months (around 05/24/2022) for already scheduled .  ? ?Margarita Mail, DO ? ?

## 2022-02-21 NOTE — Patient Instructions (Addendum)
It was great seeing you today! ? ?Plan discussed at today's visit: ?-Continue Metformin 500 mg once daily, new medication Glipizide 5 mg to take daily  ?-Continue to check fasting blood sugar in the morning and 2 hour after your largest meal ?Call to schedule your ultrasound at [phone] ? ?Follow up in: 3 months, already scheduled  ? ?Take care and let us know if you have any questions or concerns prior to your next visit. ? ?Dr. Caralee Ates ? ?

## 2022-02-21 NOTE — Assessment & Plan Note (Signed)
Reviewed lipid panel with the patient at length, ASCVD risk high at 20.9%. Patient does not want medications, will work on lifestyle and recheck in 6-12 months.  ?

## 2022-03-13 ENCOUNTER — Ambulatory Visit: Payer: BC Managed Care – PPO | Attending: Internal Medicine

## 2022-05-22 ENCOUNTER — Ambulatory Visit: Payer: BC Managed Care – PPO | Admitting: Internal Medicine

## 2022-06-13 DIAGNOSIS — M546 Pain in thoracic spine: Secondary | ICD-10-CM | POA: Diagnosis not present

## 2022-06-30 ENCOUNTER — Emergency Department
Admission: EM | Admit: 2022-06-30 | Discharge: 2022-06-30 | Disposition: A | Discharge status: Home / Self Care | Payer: BC Managed Care – PPO | Attending: Student in an Organized Health Care Education/Training Program | Admitting: Student in an Organized Health Care Education/Training Program

## 2022-06-30 ENCOUNTER — Emergency Department: Payer: BC Managed Care – PPO

## 2022-06-30 ENCOUNTER — Encounter: Payer: Self-pay | Admitting: Emergency Medicine

## 2022-06-30 DIAGNOSIS — R0789 Other chest pain: Secondary | ICD-10-CM | POA: Diagnosis not present

## 2022-06-30 DIAGNOSIS — R079 Chest pain, unspecified: Secondary | ICD-10-CM | POA: Diagnosis not present

## 2022-06-30 LAB — BASIC METABOLIC PANEL
Anion gap: 13 (ref 5–15)
BUN: 15 mg/dL (ref 6–20)
CO2: 22 mmol/L (ref 22–32)
Calcium: 8.7 mg/dL — ABNORMAL LOW (ref 8.9–10.3)
Chloride: 103 mmol/L (ref 98–111)
Creatinine, Ser: 0.86 mg/dL (ref 0.61–1.24)
GFR, Estimated: >60 mL/min (ref >60)
Glucose, Bld: 209 mg/dL — ABNORMAL HIGH (ref 70–99)
Potassium: 3.8 mmol/L (ref 3.5–5.1)
Sodium: 138 mmol/L (ref 135–145)

## 2022-06-30 LAB — D-DIMER, QUANTITATIVE: D-Dimer, Quant: 0.33 ug/mL-FEU (ref 0.00–0.50)

## 2022-06-30 LAB — CBC
HCT: 47.3 % (ref 39.0–52.0)
Hemoglobin: 16.4 g/dL (ref 13.0–17.0)
MCH: 32.9 pg (ref 26.0–34.0)
MCHC: 34.7 g/dL (ref 30.0–36.0)
MCV: 95 fL (ref 80.0–100.0)
Platelets: 210 10*3/uL (ref 150–400)
RBC: 4.98 MIL/uL (ref 4.22–5.81)
RDW: 12.2 % (ref 11.5–15.5)
WBC: 4.9 10*3/uL (ref 4.0–10.5)
nRBC: 0 % (ref 0.0–0.2)

## 2022-06-30 LAB — TROPONIN I (HIGH SENSITIVITY)
Troponin I (High Sensitivity): 7 ng/L (ref <18)
Troponin I (High Sensitivity): 5 ng/L (ref <18)

## 2022-06-30 MED ORDER — OXYCODONE-ACETAMINOPHEN 5-325 MG PO TABS: 1.0000 | ORAL_TABLET | ORAL | 0 refills | Status: DC | PRN

## 2022-06-30 MED ORDER — OXYCODONE-ACETAMINOPHEN 5-325 MG PO TABS: 1.0000 | ORAL_TABLET | Freq: Once | ORAL | Status: DC

## 2022-06-30 NOTE — ED Notes (Signed)
Patient to ED from [facility] for right sided CP for the past 2 weeks but worsening today. Patient also c/o SOB and dizziness. Patient hypertensive at 182/96. Seen for same in March.

## 2022-06-30 NOTE — ED Provider Notes (Signed)
[facility] Provider Note    Event Date/Time   First MD Initiated Contact with Patient 06/30/22 1229     (approximate)   History   Chest Pain   HPI  Colin Brooks is a 56 y.o. male presents to the ER for evaluation of several weeks of right-sided chest pain.  Worse with deep inspiration.  Denies any diaphoresis no nausea or vomiting no abdominal pain.  Lessers to follow-up for work-up but was unable to because he is out of town.  Currently states the pain is mild to moderate.  No pain radiating through to his back or up into his neck or jaw.     Physical Exam   Triage Vital Signs: ED Triage Vitals [06/30/22 1144]  Enc Vitals Group     BP (!) 169/92     Pulse Rate 88     Resp 16     Temp 98.4 F (36.9 C)     Temp Source Oral     SpO2 97 %     Weight 217 lb (98.4 kg)     Height 5\' 9"  (1.753 m)     Head Circumference      Peak Flow      Pain Score 8     Pain Loc      Pain Edu?      Excl. in GC?     Most recent vital signs: Vitals:   06/30/22 1144  BP: (!) 169/92  Pulse: 88  Resp: 16  Temp: 98.4 F (36.9 C)  SpO2: 97%     Constitutional: Alert  Eyes: Conjunctivae are normal.  Head: Atraumatic. Nose: No congestion/rhinnorhea. Mouth/Throat: Mucous membranes are moist.   Neck: Painless ROM.  Cardiovascular:   Good peripheral circulation. Respiratory: Normal respiratory effort.  No retractions.  Gastrointestinal: Soft and nontender.  Musculoskeletal:  no deformity Neurologic:  MAE spontaneously. No gross focal neurologic deficits are appreciated.  Skin:  Skin is warm, dry and intact. No rash noted. Psychiatric: Mood and affect are normal. Speech and behavior are normal.    ED Results / Procedures / Treatments   Labs (all labs ordered are listed, but only abnormal results are displayed) Labs Reviewed  BASIC METABOLIC PANEL - Abnormal; Notable for the following components:      Result Value   Glucose, Bld 209 (*)    Calcium  8.7 (*)    All other components within normal limits  CBC  D-DIMER, QUANTITATIVE  TROPONIN I (HIGH SENSITIVITY)  TROPONIN I (HIGH SENSITIVITY)     EKG  ED ECG REPORT I, 08/30/22, the attending physician, personally viewed and interpreted this ECG.   Date: 06/30/2022  EKG Time: 11:42  Rate: 80  Rhythm: sinus  Axis: left  Intervals: normal  ST&T Change: no stemi, no depressions    RADIOLOGY Please see ED Course for my review and interpretation.  I personally reviewed all radiographic images ordered to evaluate for the above acute complaints and reviewed radiology reports and findings.  These findings were personally discussed with the patient.  Please see medical record for radiology report.    PROCEDURES:  Critical Care performed: No  Procedures   MEDICATIONS ORDERED IN ED: Medications  oxyCODONE-acetaminophen (PERCOCET/ROXICET) 5-325 MG per tablet 1 tablet (has no administration in time range)     IMPRESSION / MDM / ASSESSMENT AND PLAN / ED COURSE  I reviewed the triage vital signs and the nursing notes.  Differential diagnosis includes, but is not limited to, ACS, pericarditis, esophagitis, boerhaaves, pe, dissection, pna, bronchitis, costochondritis  Patient presents to the ER for evaluation of symptoms as described above.  This presenting complaint could reflect a potentially life-threatening illness therefore the patient will be placed on continuous pulse oximetry and telemetry for monitoring.  Laboratory evaluation will be sent to evaluate for the above complaints.  Initial EKG is nonischemic.  Initial troponin is nonischemic.    His abdominal exam is soft and benign.  Does not seem consistent with referred pain from the abdomen.  He is low risk by Wells criteria will order D-dimer to further restratify.   Clinical Course as of 06/30/22 1406  Fri Jun 30, 2022  1406 Patient reassessed.  His D-dimer is negative.  His  troponin is negative.  He remains well-appearing in no acute distress.  Given duration of symptoms this is not consistent with ACS but does warrant further work-up as an outpatient.  I do believe he stable appropriate for outpatient follow-up. [PR]    Clinical Course User Index [PR] Willy Eddy, MD    FINAL CLINICAL IMPRESSION(S) / ED DIAGNOSES   Final diagnoses:  Atypical chest pain     Rx / DC Orders   ED Discharge Orders          Ordered    Ambulatory referral to Cardiology       Comments: If you have not heard from the Cardiology office within the next 72 hours please call [phone].   06/30/22 1404    oxyCODONE-acetaminophen (PERCOCET) 5-325 MG tablet  Every 4 hours PRN        06/30/22 1404             Note:  This document was prepared using Dragon voice recognition software and may include unintentional dictation errors.    Willy Eddy, MD 06/30/22 1406

## 2022-06-30 NOTE — ED Triage Notes (Signed)
Patient arrives in wheelchair by POV c/o pain under right breast/ rib cage area. Patient states he was seen here a couple weeks ago and had cardiac work up, followed up with PCP and was supposed to have MRI however could not make it due to work. Patient states pain had resolved then returned over the past two weeks.

## 2022-07-04 ENCOUNTER — Other Ambulatory Visit: Payer: Self-pay

## 2022-07-04 ENCOUNTER — Emergency Department: Payer: BC Managed Care – PPO

## 2022-07-04 ENCOUNTER — Emergency Department
Admission: EM | Admit: 2022-07-04 | Discharge: 2022-07-04 | Disposition: A | Discharge status: Home / Self Care | Payer: BC Managed Care – PPO | Attending: Emergency Medicine | Admitting: Emergency Medicine

## 2022-07-04 DIAGNOSIS — R079 Chest pain, unspecified: Secondary | ICD-10-CM | POA: Diagnosis not present

## 2022-07-04 DIAGNOSIS — R110 Nausea: Secondary | ICD-10-CM | POA: Insufficient documentation

## 2022-07-04 DIAGNOSIS — I10 Essential (primary) hypertension: Secondary | ICD-10-CM | POA: Diagnosis not present

## 2022-07-04 DIAGNOSIS — Z20822 Contact with and (suspected) exposure to covid-19: Secondary | ICD-10-CM | POA: Diagnosis not present

## 2022-07-04 DIAGNOSIS — R059 Cough, unspecified: Secondary | ICD-10-CM | POA: Diagnosis not present

## 2022-07-04 DIAGNOSIS — R630 Anorexia: Secondary | ICD-10-CM | POA: Insufficient documentation

## 2022-07-04 DIAGNOSIS — R0789 Other chest pain: Secondary | ICD-10-CM | POA: Diagnosis not present

## 2022-07-04 LAB — CBC
HCT: 50.6 % (ref 39.0–52.0)
Hemoglobin: 17.6 g/dL — ABNORMAL HIGH (ref 13.0–17.0)
MCH: 32.9 pg (ref 26.0–34.0)
MCHC: 34.8 g/dL (ref 30.0–36.0)
MCV: 94.6 fL (ref 80.0–100.0)
Platelets: 229 10*3/uL (ref 150–400)
RBC: 5.35 MIL/uL (ref 4.22–5.81)
RDW: 11.9 % (ref 11.5–15.5)
WBC: 5.6 10*3/uL (ref 4.0–10.5)
nRBC: 0 % (ref 0.0–0.2)

## 2022-07-04 LAB — RESP PANEL BY RT-PCR (FLU A&B, COVID) ARPGX2
Influenza A by PCR: NEGATIVE
Influenza B by PCR: NEGATIVE
SARS Coronavirus 2 by RT PCR: NEGATIVE

## 2022-07-04 LAB — BASIC METABOLIC PANEL
Anion gap: 15 (ref 5–15)
BUN: 9 mg/dL (ref 6–20)
CO2: 25 mmol/L (ref 22–32)
Calcium: 9.5 mg/dL (ref 8.9–10.3)
Chloride: 94 mmol/L — ABNORMAL LOW (ref 98–111)
Creatinine, Ser: 1 mg/dL (ref 0.61–1.24)
GFR, Estimated: >60 mL/min (ref >60)
Glucose, Bld: 336 mg/dL — ABNORMAL HIGH (ref 70–99)
Potassium: 3.6 mmol/L (ref 3.5–5.1)
Sodium: 134 mmol/L — ABNORMAL LOW (ref 135–145)

## 2022-07-04 LAB — TROPONIN I (HIGH SENSITIVITY)
Troponin I (High Sensitivity): 6 ng/L (ref <18)
Troponin I (High Sensitivity): 7 ng/L (ref <18)

## 2022-07-04 MED ORDER — IOHEXOL 350 MG/ML SOLN
75.0000 mL | Freq: Once | INTRAVENOUS | Status: AC | PRN
  Administered 2022-07-04: 75 mL via INTRAVENOUS

## 2022-07-04 MED ORDER — SODIUM CHLORIDE 0.9 % IV BOLUS
1000.0000 mL | Freq: Once | INTRAVENOUS | Status: AC
  Administered 2022-07-04: 1000 mL via INTRAVENOUS

## 2022-07-04 NOTE — ED Notes (Signed)
Pt presents today with R sided chest pain. Pt was seen here a few days ago and was told to return if pain is not resolved. Pt stating the pain has not improved and is pointing to their RUQ,RL rib.

## 2022-07-04 NOTE — ED Triage Notes (Signed)
Pt has been experiencing pain when taking a deep breath on the left ches/side, hot flashes, nausea, and poor appetite.

## 2022-07-04 NOTE — ED Provider Notes (Signed)
[facility] Provider Note    Event Date/Time   First MD Initiated Contact with Patient 07/04/22 1503     (approximate)   History   Chief Complaint Cough   HPI Colin Brooks is a 56 y.o. male,, hyperlipidemia, alcohol use, diabetes, presents emergency department for evaluation of cough and chest pain.  Patient states that he has been having a persistent cough with pleuritic type chest pain on the left side.  He was seen in the emergency department last week on 06/30/2022, where his workup was ultimately negative and he was prescribed Percocet for the pain and provided a referral to cardiology.  Since then, he states that his symptoms have worsened and he has additionally developed hot flashes, nausea, and decreased appetite.  Denies abdominal pain, flank pain, vomiting, diarrhea, dysuria, vision change, hearing changes, rash/lesions, or dizziness/lightheadedness.  History Limitations: No limitations.        Physical Exam  Triage Vital Signs: ED Triage Vitals  Enc Vitals Group     BP 07/04/22 1316 (!) 185/106     Pulse Rate 07/04/22 1316 85     Resp 07/04/22 1316 17     Temp 07/04/22 1316 98.5 F (36.9 C)     Temp Source 07/04/22 1316 Oral     SpO2 07/04/22 1316 97 %     Weight 07/04/22 1317 216 lb 0.8 oz (98 kg)     Height 07/04/22 1317 5\' 9"  (1.753 m)     Head Circumference --      Peak Flow --      Pain Score 07/04/22 1317 8     Pain Loc --      Pain Edu? --      Excl. in GC? --     Most recent vital signs: Vitals:   07/04/22 1541 07/04/22 1759  BP: (!) 156/81 (!) 152/80  Pulse: 79 79  Resp: 17 12  Temp:    SpO2: 95% 99%    General: Awake, NAD.  Skin: Warm, dry. No rashes or lesions.  Eyes: PERRL. Conjunctivae normal.  CV: Good peripheral perfusion.  Resp: Normal effort.  Lung sounds are clear bilaterally. Abd: Soft, non-tender. No distention.  Neuro: At baseline. No gross neurological deficits.   Focused Exam: Throat exam  unremarkable, no tonsillar swelling or exudates.  No gross deformities or ecchymosis to the chest wall.  Physical Exam    ED Results / Procedures / Treatments  Labs (all labs ordered are listed, but only abnormal results are displayed) Labs Reviewed  CBC - Abnormal; Notable for the following components:      Result Value   Hemoglobin 17.6 (*)    All other components within normal limits  BASIC METABOLIC PANEL - Abnormal; Notable for the following components:   Sodium 134 (*)    Chloride 94 (*)    Glucose, Bld 336 (*)    All other components within normal limits  RESP PANEL BY RT-PCR (FLU A&B, COVID) ARPGX2  TROPONIN I (HIGH SENSITIVITY)  TROPONIN I (HIGH SENSITIVITY)     EKG Sinus rhythm, rate of 85, no segment changes, no AV blocks, no axis deviations, normal QRS interval, no QT prolongation.   RADIOLOGY  ED Provider Interpretation: I personally viewed and interpreted this CT scan.  No evidence of pulmonary embolism.  CT Angio Chest PE W/Cm &/Or Wo Cm  Result Date: 07/04/2022 CLINICAL DATA:  RIGHT-side chest pain question pulmonary embolism EXAM: CT ANGIOGRAPHY CHEST WITH CONTRAST TECHNIQUE: Multidetector CT imaging  of the chest was performed using the standard protocol during bolus administration of intravenous contrast. Multiplanar CT image reconstructions and MIPs were obtained to evaluate the vascular anatomy. RADIATION DOSE REDUCTION: This exam was performed according to the departmental dose-optimization program which includes automated exposure control, adjustment of the mA and/or kV according to patient size and/or use of iterative reconstruction technique. CONTRAST:  56mL OMNIPAQUE IOHEXOL 350 MG/ML SOLN IV COMPARISON:  01/20/2022 FINDINGS: Cardiovascular: Aorta normal caliber without aneurysm or dissection. Scattered atherosclerotic calcifications of aorta and coronary arteries. No pericardial effusion. Pulmonary arteries adequately opacified and patent. No evidence of  pulmonary embolism. Mediastinum/Nodes: Esophagus unremarkable. Base of cervical region normal appearance. No thoracic adenopathy. Lungs/Pleura: Lungs clear. No pulmonary infiltrate, pleural effusion, or pneumothorax. Upper Abdomen: Visualized upper abdomen unremarkable Musculoskeletal: No acute osseous findings. Multiple RIGHT rib fractures. Review of the MIP images confirms the above findings. IMPRESSION: No evidence of pulmonary embolism. Scattered atherosclerotic calcifications including coronary arteries. No acute intrathoracic abnormalities. Aortic Atherosclerosis (ICD10-I70.0). Electronically Signed   By: Ulyses Southward M.D.   On: 07/04/2022 16:11    PROCEDURES:  Critical Care performed: N/A.  Procedures    MEDICATIONS ORDERED IN ED: Medications  sodium chloride 0.9 % bolus 1,000 mL (0 mLs Intravenous Stopped 07/04/22 1759)  iohexol (OMNIPAQUE) 350 MG/ML injection 75 mL (75 mLs Intravenous Contrast Given 07/04/22 1548)     IMPRESSION / MDM / ASSESSMENT AND PLAN / ED COURSE  I reviewed the triage vital signs and the nursing notes.                              Differential diagnosis includes, but is not limited to, ACS, PE, pericarditis, myocarditis, esophagitis, influenza/COVID-19, bronchitis, pneumonia  ED Course Patient appears well, notably hypertensive at 185/106, consistent with his history of hypertension.  NAD at this time.  Afebrile.  Vitals are otherwise normal.  CBC shows no leukocytosis or anemia.  BMP shows elevated glucose at 336.  He reports not taking his metformin for the past few months due to diarrhea.  Advised him to follow-up with his primary care provider for ongoing glucose management.  Will initiate IV fluids here.  Initial troponin elevated at 6.  Second troponin 7.  EKG unremarkable.  Unlikely ACS or myocarditis or pericarditis.  Respiratory panel negative for COVID-19 or influenza.   Assessment/Plan Patient presents with persistent cough and left-sided  pleuritic type chest pain that has been going on for over a week.  Workup has been overall unremarkable.  EKG unremarkable, troponins are reassuring.  CT angio shows no evidence of pulmonary embolism or any acute pathology. Clinically he appears well.  He has not seen his cardiologist yet.  Encouraged him to do so for further evaluation.  However, reassured him that he appears well and is safe at this time.  Will discharge.  Considered admission for this patient, but given his unremarkable workup and close follow-up, he is unlikely to benefit from admission.  Provided the patient with anticipatory guidance, return precautions, and educational material. Encouraged the patient to return to the emergency department at any time if they begin to experience any new or worsening symptoms. Patient expressed understanding and agreed with the plan.   Patient's presentation is most consistent with acute complicated illness / injury requiring diagnostic workup.       FINAL CLINICAL IMPRESSION(S) / ED DIAGNOSES   Final diagnoses:  Chest pain, unspecified type     Rx /  DC Orders   ED Discharge Orders          Ordered    Ambulatory referral to Cardiology       Comments: If you have not heard from the Cardiology office within the next 72 hours please call [phone].   07/04/22 1751             Note:  This document was prepared using Dragon voice recognition software and may include unintentional dictation errors.   Varney Daily, [place] 07/05/22 Mariann Laster    Merwyn Katos, MD 07/06/22 [phone]

## 2022-07-04 NOTE — Discharge Instructions (Addendum)
-  Please follow-up with your cardiologist as discussed.  -You may continue to take the medications previously prescribed to you.  -Return to the emergency department anytime if you begin to experience any new or worsening symptoms.

## 2022-07-17 DIAGNOSIS — E119 Type 2 diabetes mellitus without complications: Secondary | ICD-10-CM | POA: Diagnosis not present

## 2022-07-17 DIAGNOSIS — R0789 Other chest pain: Secondary | ICD-10-CM | POA: Diagnosis not present

## 2022-07-17 DIAGNOSIS — Z87891 Personal history of nicotine dependence: Secondary | ICD-10-CM | POA: Diagnosis not present

## 2022-07-17 DIAGNOSIS — R079 Chest pain, unspecified: Secondary | ICD-10-CM | POA: Diagnosis not present

## 2022-07-17 DIAGNOSIS — I10 Essential (primary) hypertension: Secondary | ICD-10-CM | POA: Diagnosis not present

## 2022-07-17 DIAGNOSIS — E785 Hyperlipidemia, unspecified: Secondary | ICD-10-CM | POA: Diagnosis not present

## 2022-07-17 DIAGNOSIS — M549 Dorsalgia, unspecified: Secondary | ICD-10-CM | POA: Diagnosis not present

## 2022-07-24 ENCOUNTER — Encounter: Payer: Self-pay | Admitting: Internal Medicine

## 2022-07-24 ENCOUNTER — Ambulatory Visit: Payer: BC Managed Care – PPO | Admitting: Internal Medicine

## 2022-07-24 ENCOUNTER — Telehealth: Payer: Self-pay | Admitting: Internal Medicine

## 2022-07-24 VITALS — BP 160/80 | HR 80 | Temp 98.4°F | Resp 16 | Ht 69.0 in | Wt 226.7 lb

## 2022-07-24 DIAGNOSIS — B0229 Other postherpetic nervous system involvement: Secondary | ICD-10-CM | POA: Diagnosis not present

## 2022-07-24 DIAGNOSIS — I10 Essential (primary) hypertension: Secondary | ICD-10-CM | POA: Diagnosis not present

## 2022-07-24 DIAGNOSIS — Z1211 Encounter for screening for malignant neoplasm of colon: Secondary | ICD-10-CM | POA: Diagnosis not present

## 2022-07-24 DIAGNOSIS — E1165 Type 2 diabetes mellitus with hyperglycemia: Secondary | ICD-10-CM | POA: Diagnosis not present

## 2022-07-24 DIAGNOSIS — E782 Mixed hyperlipidemia: Secondary | ICD-10-CM

## 2022-07-24 LAB — POCT GLYCOSYLATED HEMOGLOBIN (HGB A1C): Hemoglobin A1C: 9.1 % — AB (ref 4.0–5.6)

## 2022-07-24 MED ORDER — TRULICITY 0.75 MG/0.5ML ~~LOC~~ SOAJ: 0.7500 mg | SUBCUTANEOUS | 0 refills | Status: DC

## 2022-07-24 MED ORDER — GABAPENTIN 300 MG PO CAPS: 300.0000 mg | ORAL_CAPSULE | Freq: Three times a day (TID) | ORAL | 1 refills | Status: DC

## 2022-07-24 MED ORDER — VALACYCLOVIR HCL 1 G PO TABS: 1000.0000 mg | ORAL_TABLET | Freq: Three times a day (TID) | ORAL | 0 refills | Status: AC

## 2022-07-24 NOTE — Telephone Encounter (Signed)
Patient called in to state that medication previously ordered during OV today had not been received by the pharmacy. Patient states he is at the [facility] on [address] currently. Will notify provider to resend requested medications to the pharmacy.

## 2022-07-24 NOTE — Telephone Encounter (Signed)
E prescribed status on all 3 meds state confirmed the received.  Pt will check back

## 2022-07-24 NOTE — Progress Notes (Signed)
New Patient Office Visit  Subjective:  Patient ID: Colin Brooks, male    DOB: 28-Jul-1966  Age: 56 y.o. MRN: [id number]  CC:  Chief Complaint  Patient presents with   Follow-up    ER folow-up possible shingles   Hypertension   Diabetes    HPI Colin Brooks here for ER follow up.  Discharge Date: 07/17/22 Hospital/facility: [facility] Diagnosis: post herpetic pain Procedures/tests: chest x-ray normal. Renal US normal. POCUS cardiac [place] negative.  Consultants: None New medications: Toradol injection, Baclofen 10 mg, Gabapentin 200 mg TID Discontinued medications: None Status: fluctuating   BACK PAIN Duration:  2 months Mechanism of injury: no trauma Location: Right upper flank wrapping around to chest  Onset: sudden Severity: severe Quality: burning Frequency: constant Radiation: none Aggravating factors: none; clothing makes it worse Alleviating factors: Gabapentin helping  Status: fluctuating Treatments attempted: APAP and ibuprofen  Relief with NSAIDs?: no No rash or skin changes   Diabetes, Type 2: -Last A1c 02/17/22 9.1% -Medications: Nothing currently  -Failed Meds: Metformin - could not tolerate due to GI upset and diarrhea; Glipizide not taking  -Checking BG at home: 189 in the morning 240 post-prandial  -Eye exam: Referral placed -Foot exam: Due today -Microalbumin: UTD -Statin: No -PNA vaccine: No -Denies symptoms of hypoglycemia, polyuria, polydipsia, numbness extremities, foot ulcers/trauma.   Hypertension: -Medications: none, had a few high blood pressures in the ER -Checking BP at home (average): <150/80 -Denies any SOB, CP, vision changes, LE edema or symptoms of hypotension  HLD: -Medications: None -Last lipid panel: Lipid Panel     Component Value Date/Time   CHOL 269 (H) 02/17/2022 1347   TRIG 478 (H) 02/17/2022 1347   HDL 46 02/17/2022 1347   CHOLHDL 5.8 (H) 02/17/2022 1347   LDLCALC  02/17/2022 1347     Comment:     . LDL cholesterol not  calculated. Triglyceride levels greater than 400 mg/dL invalidate calculated LDL results. . Reference range: <100 . Desirable range <100 mg/dL for primary prevention;   <70 mg/dL for patients with CHD or diabetic patients  with > or = 2 CHD risk factors. Marland Kitchen LDL-C is now calculated using the Martin-Hopkins  calculation, which is a validated novel method providing  better accuracy than the Friedewald equation in the  estimation of LDL-C.  Horald Pollen et al. Lenox Ahr. 2423;536(14): 2061-2068  (http://education.[facility].com/faq/FAQ164)    The 10-year ASCVD risk score (Arnett DK, et al., 2019) is: 27%   Values used to calculate the score:     Age: 68 years     Sex: Male     Is Non-Hispanic African American: No     Diabetic: Yes     Tobacco smoker: No     Systolic Blood Pressure: 160 mmHg     Is BP treated: No     HDL Cholesterol: 46 mg/dL     Total Cholesterol: 269 mg/dL    Past Medical History:  Diagnosis Date   Back pain    Diabetes mellitus without complication (HCC)    Hypertension     History reviewed. No pertinent surgical history.  Family History  Problem Relation Age of Onset   Cancer Mother        breast   Diabetes Mother    Heart disease Father     Social History   Socioeconomic History   Marital status: Single    Spouse name: Not on file   Number of children: Not on file   Years of education: Not on  file   Highest education level: Not on file  Occupational History   Not on file  Tobacco Use   Smoking status: Former    Types: Cigarettes    Quit date: 12/28/2018    Years since quitting: 3.5   Smokeless tobacco: Never  Vaping Use   Vaping Use: Never used  Substance and Sexual Activity   Alcohol use: Yes    Alcohol/week: 12.0 standard drinks of alcohol    Types: 12 Cans of beer per week    Comment: per week   Drug use: Yes    Types: Marijuana   Sexual activity: Yes  Other Topics Concern   Not on file  Social History Narrative   Not on file    Social Determinants of Health   Financial Resource Strain: Not on file  Food Insecurity: Not on file  Transportation Needs: Not on file  Physical Activity: Not on file  Stress: Not on file  Social Connections: Not on file  Intimate Partner Violence: Not on file    ROS Review of Systems  Constitutional:  Negative for chills and fever.  Eyes:  Negative for visual disturbance.  Respiratory:  Negative for cough and shortness of breath.   Cardiovascular:  Negative for chest pain.  Gastrointestinal:  Negative for abdominal pain, nausea and vomiting.  Musculoskeletal:  Positive for back pain.  Skin:  Negative for rash.    Objective:   Today's Vitals: BP (!) 160/80   Pulse 80   Temp 98.4 F (36.9 C)   Resp 16   Ht 5\' 9"  (1.753 m)   Wt 226 lb 11.2 oz (102.8 kg)   SpO2 95%   BMI 33.48 kg/m   Physical Exam Constitutional:      Appearance: Normal appearance.  HENT:     Head: Normocephalic and atraumatic.     Mouth/Throat:     Mouth: Mucous membranes are moist.     Pharynx: Oropharynx is clear.  Eyes:     Conjunctiva/sclera: Conjunctivae normal.  Cardiovascular:     Rate and Rhythm: Normal rate and regular rhythm.     Pulses:          Dorsalis pedis pulses are 2+ on the right side and 2+ on the left side.  Pulmonary:     Effort: Pulmonary effort is normal.     Breath sounds: Normal breath sounds.  Musculoskeletal:     Right foot: Normal range of motion. No deformity, bunion, Charcot foot, foot drop or prominent metatarsal heads.     Left foot: Normal range of motion. No deformity, bunion, Charcot foot, foot drop or prominent metatarsal heads.  Feet:     Right foot:     Protective Sensation: 6 sites tested.  6 sites sensed.     Skin integrity: Skin integrity normal.     Toenail Condition: Right toenails are normal.     Left foot:     Protective Sensation: 6 sites tested.  6 sites sensed.     Skin integrity: Skin integrity normal.     Toenail Condition: Left  toenails are normal.  Skin:    General: Skin is warm and dry.     Comments: No rash or skin change over pain locations - right sided back pain at the level of T5-7 wrapping around right side to right chest wall   Neurological:     General: No focal deficit present.     Mental Status: He is alert. Mental status is at baseline.  Psychiatric:  Mood and Affect: Mood normal.        Behavior: Behavior normal.     Assessment & Plan:   1. Type 2 diabetes mellitus with hyperglycemia, without long-term current use of insulin (HCC): A1c here still 9.1% but he is not on any medication. Could not tolerate Metformin or Glipizide. Will try Trulicity weekly for better A1c control. Start this week and will follow up in 1 month to recheck. No personal or family history of medullary thyroid carcinoma. Does have a history of alcohol abuse disorder, discussed how medication could increase risk of pancreatitis and I recommend not drinking while on the medication. Diabetic foot exam today.   - POCT HgB A1C - HM Diabetes Foot Exam - Dulaglutide (TRULICITY) 0.75 MG/0.5ML SOPN; Inject 0.75 mg into the skin once a week.  Dispense: 2 mL; Refill: 0  2. Screening for colon cancer: Cologuard ordered.   - Cologuard  3. Post herpetic neuralgia: Will complete an antiviral course; Valtrex sent to pharmacy. Will increase Gabapentin to 300 mg TID.  - valACYclovir (VALTREX) 1000 MG tablet; Take 1 tablet (1,000 mg total) by mouth 3 (three) times daily for 7 days.  Dispense: 21 tablet; Refill: 0 - gabapentin (NEURONTIN) 300 MG capsule; Take 1 capsule (300 mg total) by mouth 3 (three) times daily.  Dispense: 90 capsule; Refill: 1  4. Hypertension, unspecified type: Blood pressure high today but typically runs lower at home. Discussed weight loss and dietary changes, including DASH diet and less sodium. Continue to monitor at home, recheck here in 1 month.  5. Mixed hyperlipidemia: Cardiovascular risk elevated,  discussed starting fish oil supplements for triglycerides. Plan to recheck fasting lipid panel at follow up.   Follow-up: Return in about 4 weeks (around 08/21/2022).   Margarita Mail, DO

## 2022-07-24 NOTE — Patient Instructions (Addendum)
It was great seeing you today!  Plan discussed at today's visit: -Treat shingles with antiviral medication and Gabapentin increased for nerve pain to 300 mg three times a day -Continue to monitor blood pressure at home and watch sodium in the diet -Trulicity sent to pharmacy to use once weekly -Restart fish oil  -Plan for fasting labs next time   Follow up in: 1 month   Take care and let us know if you have any questions or concerns prior to your next visit.  Dr. Caralee Ates

## 2022-08-04 DIAGNOSIS — Z1211 Encounter for screening for malignant neoplasm of colon: Secondary | ICD-10-CM | POA: Diagnosis not present

## 2022-08-11 LAB — COLOGUARD: COLOGUARD: NEGATIVE

## 2022-08-14 ENCOUNTER — Other Ambulatory Visit: Payer: Self-pay | Admitting: Internal Medicine

## 2022-08-14 DIAGNOSIS — E1165 Type 2 diabetes mellitus with hyperglycemia: Secondary | ICD-10-CM

## 2022-08-15 NOTE — Telephone Encounter (Signed)
Requested Prescriptions  Pending Prescriptions Disp Refills  . TRULICITY 8.11 BJ/4.7WG SOPN [Pharmacy Med Name: Trulicity 9.56 OZ/3.0QM Subcutaneous Solution Pen-injector] 2 mL 0    Sig: INJECT 0.75 MG SUBCUTANEOUSLY ONCE A WEEK     Endocrinology:  Diabetes - GLP-1 Receptor Agonists Failed - 08/14/2022  9:18 AM      Failed - HBA1C is between 0 and 7.9 and within 180 days    Hemoglobin A1C  Date Value Ref Range Status  07/24/2022 9.1 (A) 4.0 - 5.6 % Final   Hgb A1c MFr Bld  Date Value Ref Range Status  02/17/2022 9.1 (H) <5.7 % of total Hgb Final    Comment:    For someone without known diabetes, a hemoglobin A1c value of 6.5% or greater indicates that they may have  diabetes and this should be confirmed with a follow-up  test. . For someone with known diabetes, a value <7% indicates  that their diabetes is well controlled and a value  greater than or equal to 7% indicates suboptimal  control. A1c targets should be individualized based on  duration of diabetes, age, comorbid conditions, and  other considerations. . Currently, no consensus exists regarding use of hemoglobin A1c for diagnosis of diabetes for children. Renella Cunas - Valid encounter within last 6 months    Recent Outpatient Visits          3 weeks ago Type 2 diabetes mellitus with hyperglycemia, without long-term current use of insulin [facility])   [facility] Teodora Medici, DO   5 months ago Type 2 diabetes mellitus with hyperglycemia, without long-term current use of insulin [facility])   [facility] Teodora Medici, DO   5 months ago Type 2 diabetes mellitus with hyperglycemia, without long-term current use of insulin [facility])   [facility] Teodora Medici, DO      Future Appointments            In 1 week Teodora Medici, [facility], [facility]

## 2022-08-24 LAB — COLOGUARD: Cologuard: NEGATIVE

## 2022-08-28 ENCOUNTER — Encounter: Payer: Self-pay | Admitting: Internal Medicine

## 2022-08-28 ENCOUNTER — Ambulatory Visit: Payer: BC Managed Care – PPO | Admitting: Internal Medicine

## 2022-08-28 VITALS — BP 152/84 | HR 100 | Temp 98.3°F | Resp 18 | Ht 69.0 in | Wt 225.8 lb

## 2022-08-28 DIAGNOSIS — M792 Neuralgia and neuritis, unspecified: Secondary | ICD-10-CM | POA: Diagnosis not present

## 2022-08-28 DIAGNOSIS — E1165 Type 2 diabetes mellitus with hyperglycemia: Secondary | ICD-10-CM | POA: Diagnosis not present

## 2022-08-28 DIAGNOSIS — I10 Essential (primary) hypertension: Secondary | ICD-10-CM | POA: Diagnosis not present

## 2022-08-28 MED ORDER — LISINOPRIL 2.5 MG PO TABS: 2.5000 mg | ORAL_TABLET | Freq: Every day | ORAL | 0 refills | Status: DC

## 2022-08-28 MED ORDER — NAPROXEN 500 MG PO TABS: 500.0000 mg | ORAL_TABLET | Freq: Two times a day (BID) | ORAL | 0 refills | Status: AC

## 2022-08-28 MED ORDER — TIZANIDINE HCL 4 MG PO TABS: 4.0000 mg | ORAL_TABLET | Freq: Four times a day (QID) | ORAL | 0 refills | Status: DC | PRN

## 2022-08-28 MED ORDER — TRULICITY 1.5 MG/0.5ML ~~LOC~~ SOAJ: 1.5000 mg | SUBCUTANEOUS | 0 refills | Status: DC
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
## 2022-08-28 NOTE — Patient Instructions (Addendum)
It was great seeing you today!  Plan discussed at today's visit: -Trulicity increased to 1.5 mg, continue to check sugars at home -New medication for blood pressure and kidneys sent to pharmacy as well - Lisinopril 2.5 mg daily, continue to monitor blood pressure at home -Please be fasting for labs next time at least 8-12 hours so we can recheck cholesterol -For pain, we will try Naproxen 500 mg twice a day, muscle relaxer and Gabapentin   Follow up in: 1 month   Take care and let us know if you have any questions or concerns prior to your next visit.  Dr. Rosana Berger

## 2022-09-10 ENCOUNTER — Other Ambulatory Visit: Payer: Self-pay | Admitting: Internal Medicine

## 2022-09-10 DIAGNOSIS — B0229 Other postherpetic nervous system involvement: Secondary | ICD-10-CM

## 2022-09-11 NOTE — Telephone Encounter (Signed)
Requested medications are due for refill today.  A little too soon  Requested medications are on the active medications list.  yes  Last refill. 07/24/2022 #90 1 rf  Future visit scheduled.   yes  Notes to clinic.  Pharmacy is in [place].    Requested Prescriptions  Pending Prescriptions Disp Refills   gabapentin (NEURONTIN) 300 MG capsule [Pharmacy Med Name: Gabapentin 300 MG Oral Capsule] 90 capsule 0    Sig: TAKE 1 CAPSULE BY MOUTH THREE TIMES DAILY     Neurology: Anticonvulsants - gabapentin Passed - 09/10/2022 12:17 PM      Passed - Cr in normal range and within 360 days    Creat  Date Value Ref Range Status  02/17/2022 0.92 0.70 - 1.30 mg/dL Final   Creatinine, Ser  Date Value Ref Range Status  07/04/2022 1.00 0.61 - 1.24 mg/dL Final   Creatinine, Urine  Date Value Ref Range Status  02/17/2022 48 20 - 320 mg/dL Final         Passed - Completed PHQ-2 or PHQ-9 in the last 360 days      Passed - Valid encounter within last 12 months    Recent Outpatient Visits           2 weeks ago Type 2 diabetes mellitus with hyperglycemia, without long-term current use of insulin [facility])   [facility] Teodora Medici, DO   1 month ago Type 2 diabetes mellitus with hyperglycemia, without long-term current use of insulin [facility])   [facility] Teodora Medici, DO   6 months ago Type 2 diabetes mellitus with hyperglycemia, without long-term current use of insulin [facility])   [facility] Teodora Medici, DO   6 months ago Type 2 diabetes mellitus with hyperglycemia, without long-term current use of insulin [facility])   [facility] Teodora Medici, DO       Future Appointments             In 1 week Teodora Medici, [facility]

## 2022-09-21 ENCOUNTER — Ambulatory Visit: Payer: BC Managed Care – PPO | Admitting: Internal Medicine
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
## 2022-09-26 ENCOUNTER — Other Ambulatory Visit: Payer: Self-pay | Admitting: Internal Medicine

## 2022-09-26 DIAGNOSIS — I10 Essential (primary) hypertension: Secondary | ICD-10-CM

## 2022-09-26 NOTE — Telephone Encounter (Signed)
Requested medications are due for refill today.  Unsure  Requested medications are on the active medications list.  yes  Last refill. New medication to pt on 08/28/2022 #30 0 rf  Future visit scheduled.   yes  Notes to clinic.  New medication to pt. Pt has OV in 2 days.    Requested Prescriptions  Pending Prescriptions Disp Refills   lisinopril (ZESTRIL) 2.5 MG tablet [Pharmacy Med Name: Lisinopril 2.5 MG Oral Tablet] 30 tablet 0    Sig: Take 1 tablet by mouth once daily     Cardiovascular:  ACE Inhibitors Failed - 09/26/2022  2:57 PM      Failed - Last BP in normal range    BP Readings from Last 1 Encounters:  08/28/22 (!) 152/84         Passed - Cr in normal range and within 180 days    Creat  Date Value Ref Range Status  02/17/2022 0.92 0.70 - 1.30 mg/dL Final   Creatinine, Ser  Date Value Ref Range Status  07/04/2022 1.00 0.61 - 1.24 mg/dL Final   Creatinine, Urine  Date Value Ref Range Status  02/17/2022 48 20 - 320 mg/dL Final         Passed - K in normal range and within 180 days    Potassium  Date Value Ref Range Status  07/04/2022 3.6 3.5 - 5.1 mmol/L Final         Passed - Patient is not pregnant      Passed - Valid encounter within last 6 months    Recent Outpatient Visits           4 weeks ago Type 2 diabetes mellitus with hyperglycemia, without long-term current use of insulin [facility])   [facility] Teodora Medici, DO   2 months ago Type 2 diabetes mellitus with hyperglycemia, without long-term current use of insulin [facility])   [facility] Teodora Medici, DO   7 months ago Type 2 diabetes mellitus with hyperglycemia, without long-term current use of insulin [facility])   [facility] Teodora Medici, DO   7 months ago Type 2 diabetes mellitus with hyperglycemia, without long-term current use of insulin [facility])   [facility] Teodora Medici, DO       Future  Appointments             In 2 days Teodora Medici, [facility], [facility]
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
## 2022-09-28 ENCOUNTER — Other Ambulatory Visit: Payer: Self-pay

## 2022-09-28 ENCOUNTER — Encounter: Payer: Self-pay | Admitting: Internal Medicine

## 2022-09-28 ENCOUNTER — Ambulatory Visit (INDEPENDENT_AMBULATORY_CARE_PROVIDER_SITE_OTHER): Payer: BC Managed Care – PPO | Admitting: Internal Medicine

## 2022-09-28 VITALS — BP 142/84 | HR 86 | Temp 99.0°F | Resp 16 | Ht 69.0 in | Wt 226.5 lb

## 2022-09-28 DIAGNOSIS — B0229 Other postherpetic nervous system involvement: Secondary | ICD-10-CM

## 2022-09-28 DIAGNOSIS — R1013 Epigastric pain: Secondary | ICD-10-CM

## 2022-09-28 DIAGNOSIS — I10 Essential (primary) hypertension: Secondary | ICD-10-CM

## 2022-09-28 DIAGNOSIS — E1165 Type 2 diabetes mellitus with hyperglycemia: Secondary | ICD-10-CM | POA: Diagnosis not present

## 2022-09-28 DIAGNOSIS — Z13220 Encounter for screening for lipoid disorders: Secondary | ICD-10-CM | POA: Diagnosis not present

## 2022-09-28 MED ORDER — PANTOPRAZOLE SODIUM 40 MG PO TBEC: 40.0000 mg | DELAYED_RELEASE_TABLET | Freq: Every day | ORAL | 0 refills | Status: DC

## 2022-09-28 MED ORDER — GABAPENTIN 300 MG PO CAPS: 300.0000 mg | ORAL_CAPSULE | Freq: Three times a day (TID) | ORAL | 1 refills | Status: DC

## 2022-09-28 MED ORDER — LISINOPRIL 2.5 MG PO TABS: 2.5000 mg | ORAL_TABLET | Freq: Every day | ORAL | 0 refills | Status: DC

## 2022-09-28 MED ORDER — CELECOXIB 100 MG PO CAPS: 100.0000 mg | ORAL_CAPSULE | Freq: Two times a day (BID) | ORAL | 0 refills | Status: DC

## 2022-09-28 NOTE — Patient Instructions (Signed)
It was great seeing you today!  Plan discussed at today's visit: -Blood work ordered today, results will be uploaded to MyChart.  -Medications refilled -Start Celebrex 100 mg twice a day with food -Start Prontonix 40 mg daily for acid reflux  Follow up in: 1 month   Take care and let us know if you have any questions or concerns prior to your next visit.  Dr. Rosana Berger

## 2022-09-29 LAB — BASIC METABOLIC PANEL
BUN: 14 mg/dL (ref 7–25)
CO2: 28 mmol/L (ref 20–32)
Calcium: 9.3 mg/dL (ref 8.6–10.3)
Chloride: 101 mmol/L (ref 98–110)
Creat: 0.87 mg/dL (ref 0.70–1.30)
Glucose, Bld: 180 mg/dL — ABNORMAL HIGH (ref 65–99)
Potassium: 4.4 mmol/L (ref 3.5–5.3)
Sodium: 139 mmol/L (ref 135–146)

## 2022-09-29 LAB — LIPID PANEL
Cholesterol: 259 mg/dL — ABNORMAL HIGH (ref <200)
HDL: 42 mg/dL (ref >=40)
Non-HDL Cholesterol (Calc): 217 mg/dL (calc) — ABNORMAL HIGH (ref <130)
Total CHOL/HDL Ratio: 6.2 (calc) — ABNORMAL HIGH (ref <5.0)
Triglycerides: 643 mg/dL — ABNORMAL HIGH (ref <150)

## 2022-10-24 ENCOUNTER — Ambulatory Visit (INDEPENDENT_AMBULATORY_CARE_PROVIDER_SITE_OTHER): Payer: BC Managed Care – PPO | Admitting: Internal Medicine

## 2022-10-24 ENCOUNTER — Encounter: Payer: Self-pay | Admitting: Internal Medicine

## 2022-10-24 VITALS — BP 152/82 | HR 94 | Temp 98.5°F | Resp 16 | Ht 69.0 in | Wt 221.7 lb

## 2022-10-24 DIAGNOSIS — I10 Essential (primary) hypertension: Secondary | ICD-10-CM | POA: Diagnosis not present

## 2022-10-24 DIAGNOSIS — B0229 Other postherpetic nervous system involvement: Secondary | ICD-10-CM

## 2022-10-24 DIAGNOSIS — E1165 Type 2 diabetes mellitus with hyperglycemia: Secondary | ICD-10-CM | POA: Diagnosis not present

## 2022-10-24 DIAGNOSIS — R109 Unspecified abdominal pain: Secondary | ICD-10-CM | POA: Diagnosis not present

## 2022-10-24 LAB — POCT GLYCOSYLATED HEMOGLOBIN (HGB A1C): Hemoglobin A1C: 8.2 % — AB (ref 4.0–5.6)

## 2022-10-24 LAB — POCT URINALYSIS DIPSTICK
Bilirubin, UA: NEGATIVE
Blood, UA: NEGATIVE
Glucose, UA: POSITIVE — AB
Ketones, UA: POSITIVE
Leukocytes, UA: NEGATIVE
Nitrite, UA: NEGATIVE
Odor: NORMAL
Protein, UA: POSITIVE — AB
Spec Grav, UA: 1.015 (ref 1.010–1.025)
Urobilinogen, UA: 0.2 E.U./dL
pH, UA: 7 (ref 5.0–8.0)

## 2022-10-24 MED ORDER — TRULICITY 1.5 MG/0.5ML ~~LOC~~ SOAJ: 1.5000 mg | SUBCUTANEOUS | 3 refills | Status: DC

## 2022-10-24 MED ORDER — CELECOXIB 100 MG PO CAPS: 100.0000 mg | ORAL_CAPSULE | Freq: Two times a day (BID) | ORAL | 2 refills | Status: AC

## 2022-10-24 MED ORDER — LISINOPRIL 2.5 MG PO TABS: 2.5000 mg | ORAL_TABLET | Freq: Every day | ORAL | 0 refills | Status: DC
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
## 2022-11-03 ENCOUNTER — Telehealth: Payer: Self-pay

## 2022-11-03 NOTE — Telephone Encounter (Unsigned)
Copied from CRM #[id number]. Topic: General - Inquiry >> Nov 03, 2022  2:23 PM Teressa P wrote: Reason for CRM: Pt ask that Elisabeth Andrews DO call him back.  He has a few questions to ask her about the abd pain he has been having that they have already discussed,  CB@  [phone] 

## 2022-11-06 NOTE — Telephone Encounter (Unsigned)
Copied from CRM [id number]. Topic: General - Inquiry >> Nov 03, 2022  2:23 PM Haroldine Laws wrote: Reason for CRM: Pt ask that Margarita Mail DO call him back.  He has a few questions to ask her about the abd pain he has been having that they have already discussed,  CB@  [phone]

## 2022-12-14 ENCOUNTER — Other Ambulatory Visit: Payer: Self-pay | Admitting: Internal Medicine

## 2022-12-14 DIAGNOSIS — B0229 Other postherpetic nervous system involvement: Secondary | ICD-10-CM

## 2022-12-14 NOTE — Telephone Encounter (Signed)
Requested medications are due for refill today.  unsure  Requested medications are on the active medications list.  yes  Last refill. 09/28/2022 #90 1 rf  Future visit scheduled.   no  Notes to clinic.  Rx written to expired 11/27/2022 - Rx is expired.    Requested Prescriptions  Pending Prescriptions Disp Refills   gabapentin (NEURONTIN) 300 MG capsule 90 capsule 1    Sig: Take 1 capsule (300 mg total) by mouth 3 (three) times daily.     Neurology: Anticonvulsants - gabapentin Passed - 12/14/2022 10:20 AM      Passed - Cr in normal range and within 360 days    Creat  Date Value Ref Range Status  09/28/2022 0.87 0.70 - 1.30 mg/dL Final   Creatinine, Urine  Date Value Ref Range Status  02/17/2022 48 20 - 320 mg/dL Final         Passed - Completed PHQ-2 or PHQ-9 in the last 360 days      Passed - Valid encounter within last 12 months    Recent Outpatient Visits           1 month ago Type 2 diabetes mellitus with hyperglycemia, without long-term current use of insulin [facility])   [facility] Teodora Medici, DO   2 months ago Hypertension, unspecified type   [facility] Teodora Medici, DO   3 months ago Type 2 diabetes mellitus with hyperglycemia, without long-term current use of insulin [facility])   [facility] Teodora Medici, DO   4 months ago Type 2 diabetes mellitus with hyperglycemia, without long-term current use of insulin [facility])   [facility] Teodora Medici, DO   9 months ago Type 2 diabetes mellitus with hyperglycemia, without long-term current use of insulin [facility])   [place], [place], [place]

## 2022-12-14 NOTE — Telephone Encounter (Signed)
Medication Refill - Medication: gabapentin (NEURONTIN) 300 MG capsule [[id number]]  ENDED   Has the patient contacted their pharmacy? Yes.   (Agent: If no, request that the patient contact the pharmacy for the refill. If patient does not wish to contact the pharmacy document the reason why and proceed with request.) (Agent: If yes, when and what did the pharmacy advise?)  Preferred Pharmacy (with phone number or street name):  [place] Phone: [phone]  Fax: [phone]     Has the patient been seen for an appointment in the last year OR does the patient have an upcoming appointment? Yes.    Agent: Please be advised that RX refills may take up to 3 business days. We ask that you follow-up with your pharmacy.

## 2022-12-15 MED ORDER — GABAPENTIN 300 MG PO CAPS: 300.0000 mg | ORAL_CAPSULE | Freq: Three times a day (TID) | ORAL | 1 refills | Status: DC

## 2022-12-15 NOTE — Telephone Encounter (Signed)
Requested medication (s) are due for refill today: yes  Requested medication (s) are on the active medication list: yes but shows Prescription ended  Last refill:  09/28/22  Future visit scheduled: no  Notes to clinic:  end date was 11/27/22.   Requested Prescriptions  Pending Prescriptions Disp Refills   gabapentin (NEURONTIN) 300 MG capsule 90 capsule 1    Sig: Take 1 capsule (300 mg total) by mouth 3 (three) times daily.     Neurology: Anticonvulsants - gabapentin Passed - 12/15/2022 11:13 AM      Passed - Cr in normal range and within 360 days    Creat  Date Value Ref Range Status  09/28/2022 0.87 0.70 - 1.30 mg/dL Final   Creatinine, Urine  Date Value Ref Range Status  02/17/2022 48 20 - 320 mg/dL Final         Passed - Completed PHQ-2 or PHQ-9 in the last 360 days      Passed - Valid encounter within last 12 months    Recent Outpatient Visits           1 month ago Type 2 diabetes mellitus with hyperglycemia, without long-term current use of insulin [facility])   [facility] Teodora Medici, DO   2 months ago Hypertension, unspecified type   [facility] Teodora Medici, DO   3 months ago Type 2 diabetes mellitus with hyperglycemia, without long-term current use of insulin [facility])   [facility] Teodora Medici, DO   4 months ago Type 2 diabetes mellitus with hyperglycemia, without long-term current use of insulin [facility])   [facility] Teodora Medici, DO   9 months ago Type 2 diabetes mellitus with hyperglycemia, without long-term current use of insulin [facility])   [facility] Teodora Medici, [place]

## 2022-12-15 NOTE — Telephone Encounter (Signed)
Pt called to report that he has one more pill left

## 2023-01-19 IMAGING — CR DG FINGER LITTLE 2+V*R*
1 series · 3 of 3 positions shown · non-contrast
Comparison: None.

CLINICAL DATA: Posttraumatic fifth digit pain.

EXAM:
RIGHT LITTLE FINGER 2+V

[Series 1: dg finger little right · 0.14mm/px · 3 of 3 slices shown]
[im 1/3]
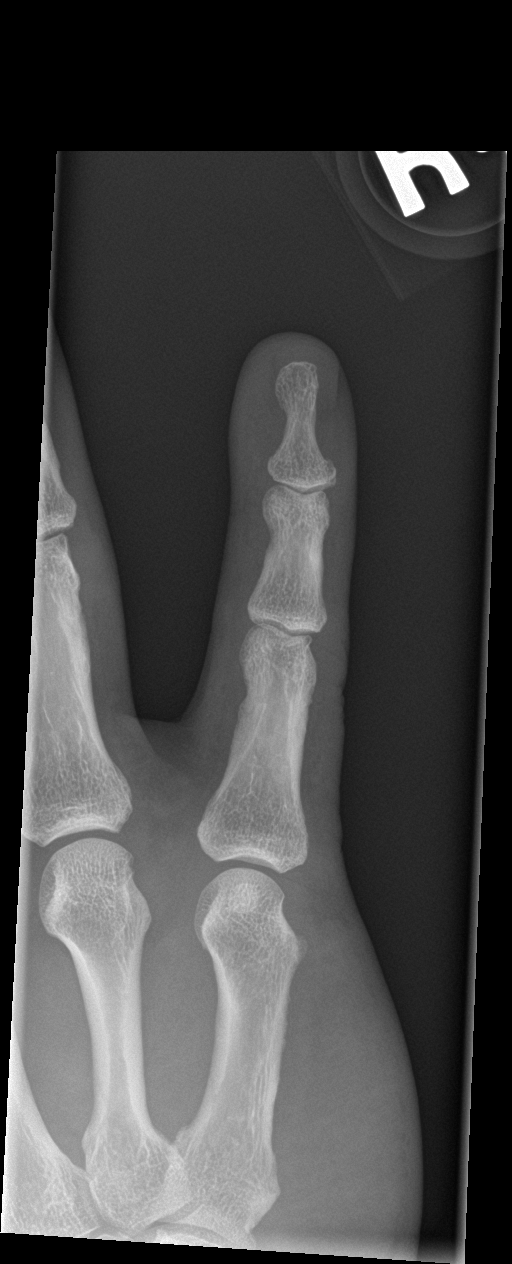
[im 2/3]
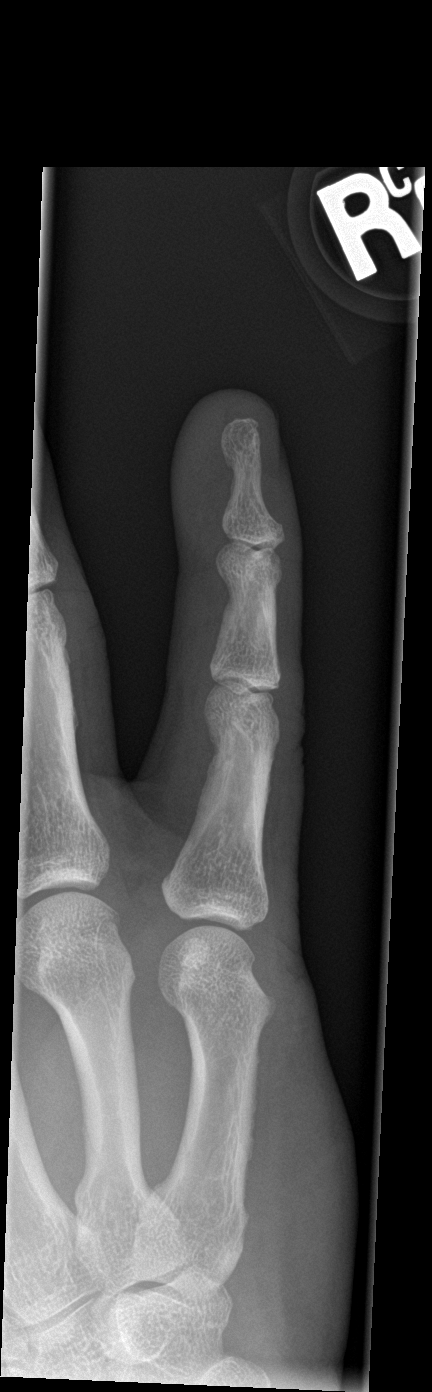
[im 3/3]
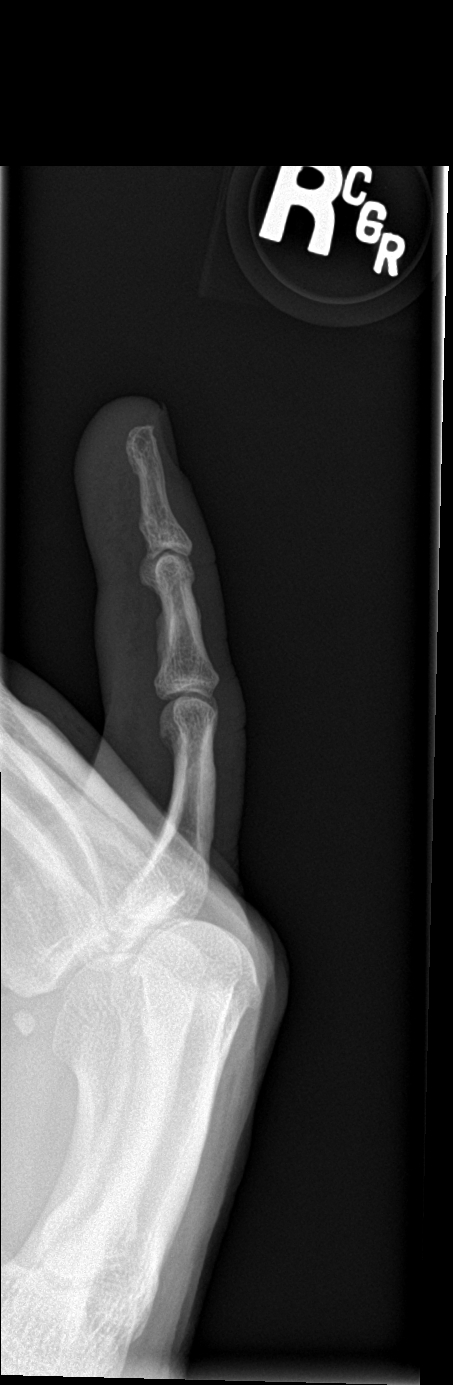

[3 of 3 positions shown; findings below may reference images not displayed]

FINDINGS: There is no evidence of fracture or dislocation. No opaque foreign
body.
IMPRESSION: Negative.

## 2023-01-23 ENCOUNTER — Telehealth: Payer: Self-pay | Admitting: Internal Medicine

## 2023-01-23 ENCOUNTER — Ambulatory Visit (INDEPENDENT_AMBULATORY_CARE_PROVIDER_SITE_OTHER): Payer: BC Managed Care – PPO | Admitting: Internal Medicine

## 2023-01-23 ENCOUNTER — Ambulatory Visit: Payer: Self-pay

## 2023-01-23 ENCOUNTER — Encounter: Payer: Self-pay | Admitting: Internal Medicine

## 2023-01-23 VITALS — BP 148/88 | HR 78 | Temp 98.0°F | Resp 18 | Wt 222.9 lb

## 2023-01-23 DIAGNOSIS — R1032 Left lower quadrant pain: Secondary | ICD-10-CM

## 2023-01-23 DIAGNOSIS — I10 Essential (primary) hypertension: Secondary | ICD-10-CM | POA: Diagnosis not present

## 2023-01-23 DIAGNOSIS — B0229 Other postherpetic nervous system involvement: Secondary | ICD-10-CM

## 2023-01-23 DIAGNOSIS — E1165 Type 2 diabetes mellitus with hyperglycemia: Secondary | ICD-10-CM | POA: Diagnosis not present

## 2023-01-23 DIAGNOSIS — K760 Fatty (change of) liver, not elsewhere classified: Secondary | ICD-10-CM

## 2023-01-23 MED ORDER — CELECOXIB 100 MG PO CAPS: 100.0000 mg | ORAL_CAPSULE | Freq: Two times a day (BID) | ORAL | 1 refills | Status: DC

## 2023-01-23 MED ORDER — LISINOPRIL 2.5 MG PO TABS: 2.5000 mg | ORAL_TABLET | Freq: Every day | ORAL | 1 refills | Status: DC

## 2023-01-23 NOTE — Progress Notes (Signed)
Established Patient Office Visit  Subjective:  Patient ID: Colin Brooks, male    DOB: [birth year]  Age: 57 y.o. MRN: [id number]  CC:  Chief Complaint  Patient presents with   Abdominal Pain    Ongoing 3-4 months wants referral to GI    HPI Colin Brooks here for follow up on abdominal pain.   Patient's pain initially started in the RUQ in March of last year. Pain was very superficial, initially thought Shingles was playing a role but anti-viral did not improve symptoms. Only thing that helps this pain is Celebrex and Gabapentin. He now has a separate LLQ pain pain that radiates to flank. This has been ongoing now for 3-4 months. Is dull and cramping in nature but becoming more intense and frequent. Pain is relieved with a BM. BM's are normal, no diarrhea or constipation and regular. Denies blood. Denies urinary symptoms. Denies fevers, nausea, vomiting. He does have some decreased appetite but is on Trulicity, weight the same.   Diabetes, Type 2: -Last A1c 11/23 8.2% -Medications: Trulicity increased to 1.5 mg  -Failed Meds: Metformin - could not tolerate due to GI upset and diarrhea; Glipizide not taking  -Checking BG at home: 135-165 -Eye exam: Referral placed, hasn't seen yet  -Foot exam: UTD 8/23 -Microalbumin: UTD 3/23 -Statin: No -PNA vaccine: No -Denies symptoms of hypoglycemia, polyuria, polydipsia, numbness extremities, foot ulcers/trauma.   Hypertension: -Medications: Lisinopril 2.5 mg -Checking BP at home (average): 120-130/7-80 -Denies any SOB, CP, vision changes, LE edema or symptoms of hypotension  HLD: -Medications: None -Last lipid panel: Lipid Panel     Component Value Date/Time   CHOL 259 (H) 09/28/2022 0826   TRIG 643 (H) 09/28/2022 0826   HDL 42 09/28/2022 0826   CHOLHDL 6.2 (H) 09/28/2022 0826   LDLCALC  09/28/2022 0826     Comment:     . LDL cholesterol not calculated. Triglyceride levels greater than 400 mg/dL invalidate calculated LDL  results. . Reference range: <100 . Desirable range <100 mg/dL for primary prevention;   <70 mg/dL for patients with CHD or diabetic patients  with > or = 2 CHD risk factors. Marland Kitchen LDL-C is now calculated using the Martin-Hopkins  calculation, which is a validated novel method providing  better accuracy than the Friedewald equation in the  estimation of LDL-C.  Colin Brooks et al. Annamaria Helling. [id number]): 2061-2068  (http://education.[employer].com/faq/FAQ164)    The 10-year ASCVD risk score (Arnett DK, et al., 2019) is: 28.5%   Values used to calculate the score:     Age: 78 years     Sex: Male     Is Non-Hispanic African American: No     Diabetic: Yes     Tobacco smoker: No     Systolic Blood Pressure: 123456 mmHg     Is BP treated: Yes     HDL Cholesterol: 42 mg/dL     Total Cholesterol: 259 mg/dL  Health Maintenance: -Blood work UTD -Cologuard negative 9/23  Past Medical History:  Diagnosis Date   Back pain    Diabetes mellitus without complication ([place])    Hypertension     History reviewed. No pertinent surgical history.  Family History  Problem Relation Age of Onset   Cancer Mother        breast   Diabetes Mother    Heart disease Father     Social History   Socioeconomic History   Marital status: Single    Spouse name: Not on file   Number of  children: Not on file   Years of education: Not on file   Highest education level: Not on file  Occupational History   Not on file  Tobacco Use   Smoking status: Former    Types: Cigarettes    Quit date: 12/28/2018    Years since quitting: 4.0   Smokeless tobacco: Never  Vaping Use   Vaping Use: Never used  Substance and Sexual Activity   Alcohol use: Yes    Alcohol/week: 12.0 standard drinks of alcohol    Types: 12 Cans of beer per week    Comment: per week   Drug use: Yes    Types: Marijuana   Sexual activity: Yes  Other Topics Concern   Not on file  Social History Narrative   Not on file   Social  Determinants of Health   Financial Resource Strain: Not on file  Food Insecurity: Not on file  Transportation Needs: Not on file  Physical Activity: Not on file  Stress: Not on file  Social Connections: Not on file  Intimate Partner Violence: Not on file    ROS Review of Systems  Constitutional:  Negative for chills and fever.  Eyes:  Negative for visual disturbance.  Respiratory:  Negative for cough and shortness of breath.   Cardiovascular:  Negative for chest pain.  Gastrointestinal:  Positive for abdominal pain. Negative for blood in stool, constipation, diarrhea, nausea and vomiting.  Genitourinary:  Positive for flank pain. Negative for dysuria, frequency and hematuria.  Skin:  Negative for rash.    Objective:   Today's Vitals: BP (!) 148/88   Pulse 78   Temp 98 F (36.7 C)   Resp 18   Wt 222 lb 14.4 oz (101.1 kg)   SpO2 97%   BMI 32.92 kg/m   Physical Exam Constitutional:      Appearance: Normal appearance.  HENT:     Head: Normocephalic and atraumatic.  Eyes:     Conjunctiva/sclera: Conjunctivae normal.  Cardiovascular:     Rate and Rhythm: Normal rate and regular rhythm.  Pulmonary:     Effort: Pulmonary effort is normal.     Breath sounds: Normal breath sounds.  Abdominal:     General: Bowel sounds are normal. There is distension.     Palpations: Abdomen is soft. There is no mass.     Tenderness: There is abdominal tenderness. There is no right CVA tenderness, left CVA tenderness, guarding or rebound.     Hernia: No hernia is present.     Comments: Tenderness in epigastric region and LLQ  Skin:    General: Skin is warm and dry.  Neurological:     General: No focal deficit present.     Mental Status: He is alert. Mental status is at baseline.  Psychiatric:        Mood and Affect: Mood normal.        Behavior: Behavior normal.     Assessment & Plan:   1. Post herpetic neuralgia: Refill Celebrex 100 mg BID. Discussed potential role with the  medication and gastric ulcer, patient states it is the only thing that helps pain and is taking with food.   - celecoxib (CELEBREX) 100 MG capsule; Take 1 capsule (100 mg total) by mouth 2 (two) times daily.  Dispense: 180 capsule; Refill: 1  2. Left lower quadrant abdominal pain: LLQ progressing, no fevers or changes in bowel habits, no blood in stools. Patient is on NSAID's. Patient is concerned about UC. I am  more concerned about diverticulitis, CT ordered. Cologuard negative in 2023. Is also on Trulicity, could be contributing more to epigastric pain and bloating.   - CT Abdomen Pelvis W Contrast; Future  3. Hypertension, unspecified type: Blood pressure still elevated but better than normal, patient resistant to increasing medication, states BP is well controlled at home. Continue Lisinopril 2.5 mg daily, refilled.  - lisinopril (ZESTRIL) 2.5 MG tablet; Take 1 tablet (2.5 mg total) by mouth daily.  Dispense: 90 tablet; Refill: 1  4. Type 2 diabetes mellitus with hyperglycemia, without long-term current use of insulin ([place]): Too soon to check A1c, plan to check next time. Continue Trulicity 1.5 mg for now.   Follow-up: Return in about 3 months (around 04/23/2023).   Teodora Medici, DO

## 2023-01-23 NOTE — Patient Instructions (Addendum)
It was great seeing you today!  Plan discussed at today's visit: -Medications refilled  -CT ordered.  Please call to schedule a date and time that works best for you at [phone] -Referral to GI placed   Follow up in: 3 months   Take care and let us know if you have any questions or concerns prior to your next visit.  Dr. Rosana Berger

## 2023-01-23 NOTE — Telephone Encounter (Signed)
Patient called in states [facility] for his ct isnt covered under his insuarnce so he needs it set up at [facility]. Please call back

## 2023-01-23 NOTE — Telephone Encounter (Signed)
  Chief Complaint: Left lower quadrant abdominal pain and swelling Symptoms: Above Frequency: Ongoing Pertinent Negatives: Patient denies fever Disposition: []$ ED /[]$ Urgent Care (no appt availability in office) / [x]$ Appointment(In office/virtual)/ []$  [place] [facility]/ []$ Home Care/ []$ Refused Recommended Disposition /[]$ [place] [facility]/ []$  Follow-up with PCP Additional Notes: Pt states that he has had left lower abdominal pain for awhile. Pt now has swelling as well.  PT states that pain is the worst in the morning when his medication has worn off over night. After taking the medication pain is reduced to 2-3/10. Pt discussed pain at last ov, but pain at that time was upper right quadrant below his ribs. PT thinks this is ulcerative colitis. PT has had trouble getting into GI office d/t travelling 3 ot of 4 weeks per month.  Reason for Disposition  [1] MODERATE pain (e.g., interferes with normal activities) AND [2] pain comes and goes (cramps) AND [3] present > 24 hours  (Exception: Pain with Vomiting or Diarrhea - see that Guideline.)  Answer Assessment - Initial Assessment Questions 1. LOCATION: "Where does it hurt?"      Left lower quadrant 2. RADIATION: "Does the pain shoot anywhere else?" (e.g., chest, back)     Goes to several different places 3. ONSET: "When did the pain begin?" (Minutes, hours or days ago)      A couple of months 4. SUDDEN: "Gradual or sudden onset?"     Gradual 5. PATTERN "Does the pain come and go, or is it constant?"    - If it comes and goes: "How long does it last?" "Do you have pain now?"     (Note: Comes and goes means the pain is intermittent. It goes away completely between bouts.)    - If constant: "Is it getting better, staying the same, or getting worse?"      (Note: Constant means the pain never goes away completely; most serious pain is constant and gets worse.)      Comes and goes 6. SEVERITY: "How bad is the pain?"  (e.g., Scale 1-10;  mild, moderate, or severe)    - MILD (1-3): Doesn't interfere with normal activities, abdomen soft and not tender to touch.     - MODERATE (4-7): Interferes with normal activities or awakens from sleep, abdomen tender to touch.     - SEVERE (8-10): Excruciating pain, doubled over, unable to do any normal activities.       With meds a 2-3/10 - without much worse 7. RECURRENT SYMPTOM: "Have you ever had this type of stomach pain before?" If Yes, ask: "When was the last time?" and "What happened that time?"      yes 8. CAUSE: "What do you think is causing the stomach pain?"     Ulcerative colitis 9. RELIEVING/AGGRAVATING FACTORS: "What makes it better or worse?" (e.g., antacids, bending or twisting motion, bowel movement)     Medications 10. OTHER SYMPTOMS: "Do you have any other symptoms?" (e.g., back pain, diarrhea, fever, urination pain, vomiting)       Swelling - left quad  Protocols used: Abdominal Pain - Male-A-AH

## 2023-01-24 ENCOUNTER — Other Ambulatory Visit: Payer: Self-pay | Admitting: Internal Medicine

## 2023-01-24 ENCOUNTER — Ambulatory Visit: Payer: BC Managed Care – PPO

## 2023-01-24 DIAGNOSIS — I10 Essential (primary) hypertension: Secondary | ICD-10-CM

## 2023-01-25 ENCOUNTER — Ambulatory Visit
Admission: RE | Admit: 2023-01-25 | Discharge: 2023-01-25 | Disposition: A | Discharge status: Home / Self Care | Payer: BC Managed Care – PPO | Source: Ambulatory Visit | Attending: Internal Medicine | Admitting: Internal Medicine

## 2023-01-25 DIAGNOSIS — I700 Atherosclerosis of aorta: Secondary | ICD-10-CM | POA: Diagnosis not present

## 2023-01-25 DIAGNOSIS — R1032 Left lower quadrant pain: Secondary | ICD-10-CM

## 2023-01-25 DIAGNOSIS — N281 Cyst of kidney, acquired: Secondary | ICD-10-CM | POA: Diagnosis not present

## 2023-01-25 DIAGNOSIS — K760 Fatty (change of) liver, not elsewhere classified: Secondary | ICD-10-CM | POA: Diagnosis not present

## 2023-01-25 DIAGNOSIS — K429 Umbilical hernia without obstruction or gangrene: Secondary | ICD-10-CM | POA: Diagnosis not present

## 2023-01-25 MED ORDER — BARIUM SULFATE 2 % PO SUSP: 450.0000 mL | Freq: Once | ORAL | Status: DC

## 2023-01-25 MED ORDER — IOPAMIDOL (ISOVUE-300) INJECTION 61%
100.0000 mL | Freq: Once | INTRAVENOUS | Status: AC | PRN
  Administered 2023-01-25: 100 mL via INTRAVENOUS

## 2023-01-25 NOTE — Telephone Encounter (Signed)
Requested Prescriptions  Refused Prescriptions Disp Refills   lisinopril (ZESTRIL) 2.5 MG tablet [Pharmacy Med Name: Lisinopril 2.5 MG Oral Tablet] 90 tablet 0    Sig: Take 1 tablet by mouth once daily     Cardiovascular:  ACE Inhibitors Failed - 01/24/2023 12:34 PM      Failed - Last BP in normal range    BP Readings from Last 1 Encounters:  01/23/23 (!) 148/88         Passed - Cr in normal range and within 180 days    Creat  Date Value Ref Range Status  09/28/2022 0.87 0.70 - 1.30 mg/dL Final   Creatinine, Urine  Date Value Ref Range Status  02/17/2022 48 20 - 320 mg/dL Final         Passed - K in normal range and within 180 days    Potassium  Date Value Ref Range Status  09/28/2022 4.4 3.5 - 5.3 mmol/L Final         Passed - Patient is not pregnant      Passed - Valid encounter within last 6 months    Recent Outpatient Visits           2 days ago Left lower quadrant abdominal pain   [facility] Teodora Medici, DO   3 months ago Type 2 diabetes mellitus with hyperglycemia, without long-term current use of insulin [facility])   [facility] Teodora Medic[facility], DO   3 months ago Hypertension, unspecified type   [facility] Teodora Medici, DO   5 months ago Type 2 diabetes mellitus with hyperglycemia, without long-term current use of insulin [facility])   [facility] Teodora Medici, DO   6 months ago Type 2 diabetes mellitus with hyperglycemia, without long-term current use of insulin [facility])   [facility] Teodora Medici, [place]

## 2023-01-28 NOTE — Addendum Note (Signed)
Addended by: Teodora Medici on: 01/28/2023 04:19 PM   Modules accepted: Orders

## 2023-02-20 ENCOUNTER — Other Ambulatory Visit: Payer: Self-pay | Admitting: Internal Medicine

## 2023-02-20 DIAGNOSIS — E1165 Type 2 diabetes mellitus with hyperglycemia: Secondary | ICD-10-CM

## 2023-02-20 NOTE — Telephone Encounter (Signed)
Medication Refill - Medication:  Dulaglutide (TRULICITY) 1.5 0000000 SOPN   Has the patient contacted their pharmacy? Yes.   Pharmacy stated he didn't have anymore refills. Patient will take his last injection on Sunday.  Preferred Pharmacy (with phone number or street name):  [facility] [place] [place] - [address] Phone: [phone]  Fax: [phone]      Has the patient been seen for an appointment in the last year OR does the patient have an upcoming appointment? No.

## 2023-02-21 MED ORDER — TRULICITY 1.5 MG/0.5ML ~~LOC~~ SOAJ: 1.5000 mg | SUBCUTANEOUS | 1 refills | Status: DC

## 2023-02-21 NOTE — Telephone Encounter (Signed)
Requested Prescriptions  Pending Prescriptions Disp Refills   Dulaglutide (TRULICITY) 1.5 0000000 SOPN 2 mL 1    Sig: Inject 1.5 mg into the skin once a week.     Endocrinology:  Diabetes - GLP-1 Receptor Agonists Failed - 02/20/2023  4:01 PM      Failed - HBA1C is between 0 and 7.9 and within 180 days    Hemoglobin A1C  Date Value Ref Range Status  10/24/2022 8.2 (A) 4.0 - 5.6 % Final   Hgb A1c MFr Bld  Date Value Ref Range Status  02/17/2022 9.1 (H) <5.7 % of total Hgb Final    Comment:    For someone without known diabetes, a hemoglobin A1c value of 6.5% or greater indicates that they may have  diabetes and this should be confirmed with a follow-up  test. . For someone with known diabetes, a value <7% indicates  that their diabetes is well controlled and a value  greater than or equal to 7% indicates suboptimal  control. A1c targets should be individualized based on  duration of diabetes, age, comorbid conditions, and  other considerations. . Currently, no consensus exists regarding use of hemoglobin A1c for diagnosis of diabetes for children. Renella Cunas - Valid encounter within last 6 months    Recent Outpatient Visits           4 weeks ago Left lower quadrant abdominal pain   [facility] Teodora Medici, DO   4 months ago Type 2 diabetes mellitus with hyperglycemia, without long-term current use of insulin [facility])   [facility] Teodora Medici, DO   4 months ago Hypertension, unspecified type   [facility] Teodora Medici, DO   5 months ago Type 2 diabetes mellitus with hyperglycemia, without long-term current use of insulin [facility])   [facility] Teodora Medici, DO   7 months ago Type 2 diabetes mellitus with hyperglycemia, without long-term current use of insulin [facility])   [facility] Teodora Medici, [place]

## 2023-02-22 ENCOUNTER — Other Ambulatory Visit: Payer: Self-pay | Admitting: Internal Medicine

## 2023-02-22 DIAGNOSIS — B0229 Other postherpetic nervous system involvement: Secondary | ICD-10-CM

## 2023-02-22 NOTE — Telephone Encounter (Signed)
Requested Prescriptions  Pending Prescriptions Disp Refills   gabapentin (NEURONTIN) 300 MG capsule [Pharmacy Med Name: Gabapentin 300 MG Oral Capsule] 270 capsule 0    Sig: TAKE 1 CAPSULE BY MOUTH THREE TIMES DAILY     Neurology: Anticonvulsants - gabapentin Passed - 02/22/2023  6:35 AM      Passed - Cr in normal range and within 360 days    Creat  Date Value Ref Range Status  09/28/2022 0.87 0.70 - 1.30 mg/dL Final   Creatinine, Urine  Date Value Ref Range Status  02/17/2022 48 20 - 320 mg/dL Final         Passed - Completed PHQ-2 or PHQ-9 in the last 360 days      Passed - Valid encounter within last 12 months    Recent Outpatient Visits           1 month ago Left lower quadrant abdominal pain   [facility] Teodora Medici, DO   4 months ago Type 2 diabetes mellitus with hyperglycemia, without long-term current use of insulin [facility])   [facility] Teodora Medic[facility], DO   4 months ago Hypertension, unspecified type   [facility] Teodora Medici, DO   5 months ago Type 2 diabetes mellitus with hyperglycemia, without long-term current use of insulin [facility])   [facility] Teodora Medici, DO   7 months ago Type 2 diabetes mellitus with hyperglycemia, without long-term current use of insulin [facility])   [facility] Teodora Medici, [place]

## 2023-04-30 ENCOUNTER — Telehealth: Payer: Self-pay | Admitting: Internal Medicine

## 2023-04-30 ENCOUNTER — Other Ambulatory Visit: Payer: Self-pay | Admitting: Internal Medicine

## 2023-04-30 DIAGNOSIS — E1165 Type 2 diabetes mellitus with hyperglycemia: Secondary | ICD-10-CM

## 2023-04-30 MED ORDER — TIRZEPATIDE 2.5 MG/0.5ML ~~LOC~~ SOAJ: 2.5000 mg | SUBCUTANEOUS | 0 refills | Status: DC

## 2023-04-30 NOTE — Telephone Encounter (Signed)
Copied from CRM [id number]. Topic: General - Other >> Apr 30, 2023  2:07 PM Dondra Prader E wrote: Reason for CRM: Pt called reporting that he has been unable to get his trulicity because of the national shortage, please advise

## 2023-05-01 ENCOUNTER — Telehealth: Payer: Self-pay

## 2023-05-01 NOTE — Telephone Encounter (Signed)
Prior auth started on [employer] through cover my meds on 05/01/23

## 2023-05-02 ENCOUNTER — Ambulatory Visit: Payer: Self-pay | Admitting: *Deleted

## 2023-05-02 NOTE — Telephone Encounter (Signed)
  Chief Complaint: elevated BP while at dentist office Symptoms: toothache needs extraction of tooth. Elevated BP 203/110 and 197/104 while at dentist office today. At home now reports some lightheadedness. BP 182/93 and rechecked for 158/93.  Frequency: today  Pertinent Negatives: Patient denies chest pain no difficulty breathing no weakness on either side of body. No N/T  Disposition: [] ED /[] Urgent Care (no appt availability in office) / [x] Appointment(In office/virtual)/ []  [place] [facility]/ [] Home Care/ [] Refused Recommended Disposition /[] [place] [facility]/ []  Follow-up with PCP Additional Notes:   Appt scheduled for tomorrow. Recommended to increase water intake and decrease time out in heat outside. Recommended if sx worsen call back or go to ED.    Reason for Disposition  Systolic BP  >= 180 OR Diastolic >= 110  Answer Assessment - Initial Assessment Questions 1. BLOOD PRESSURE: "What is the blood pressure?" "Did you take at least two measurements 5 minutes apart?"     BP 182/93 and BP 158/93 2. ONSET: "When did you take your blood pressure?"     Now  3. HOW: "How did you take your blood pressure?" (e.g., automatic home BP monitor, visiting nurse)     Home monitor 4. HISTORY: "Do you have a history of high blood pressure?"     Hx  5. MEDICINES: "Are you taking any medicines for blood pressure?" "Have you missed any doses recently?"     Yes  6. OTHER SYMPTOMS: "Do you have any symptoms?" (e.g., blurred vision, chest pain, difficulty breathing, headache, weakness)     Lightheadedness . No other sx reports while in dentist office BP 203/110 and 197/104 7. PREGNANCY: "Is there any chance you are pregnant?" "When was your last menstrual period?"     na  Protocols used: Blood Pressure - High-A-AH

## 2023-05-03 ENCOUNTER — Other Ambulatory Visit: Payer: Self-pay

## 2023-05-03 ENCOUNTER — Encounter: Payer: Self-pay | Admitting: Internal Medicine

## 2023-05-03 ENCOUNTER — Ambulatory Visit: Payer: BC Managed Care – PPO | Admitting: Internal Medicine

## 2023-05-03 VITALS — BP 144/86 | HR 73 | Temp 98.0°F | Resp 16 | Ht 69.0 in | Wt 218.0 lb

## 2023-05-03 DIAGNOSIS — I10 Essential (primary) hypertension: Secondary | ICD-10-CM | POA: Diagnosis not present

## 2023-05-03 DIAGNOSIS — E1165 Type 2 diabetes mellitus with hyperglycemia: Secondary | ICD-10-CM

## 2023-05-03 DIAGNOSIS — L209 Atopic dermatitis, unspecified: Secondary | ICD-10-CM | POA: Diagnosis not present

## 2023-05-03 MED ORDER — HYDRALAZINE HCL 10 MG PO TABS: 10.0000 mg | ORAL_TABLET | Freq: Every day | ORAL | 0 refills | Status: DC | PRN

## 2023-05-03 MED ORDER — HYDROCORTISONE 0.5 % EX CREA: 1.0000 | TOPICAL_CREAM | Freq: Two times a day (BID) | CUTANEOUS | 0 refills | Status: AC

## 2023-05-03 MED ORDER — LISINOPRIL 10 MG PO TABS: 10.0000 mg | ORAL_TABLET | Freq: Every day | ORAL | 1 refills | Status: DC

## 2023-05-03 NOTE — Progress Notes (Signed)
Established Patient Office Visit  Subjective:  Patient ID: Colin Brooks, male    DOB: 05-01-1966  Age: 57 y.o. MRN: [id number]  CC:  Chief Complaint  Patient presents with   Hypertension    BP elevated when at dental office for extraction. BP 200/110.    HPI Colin Brooks here for high blood pressure. Patient has a decayed tooth that needs pulled, went to the dentist multiple times but could not have it pulled due to BP being 200/110.   Diabetes, Type 2: -Last A1c 11/23 8.2% -Medications: Trulicity increased to 1.5 mg but due to availability issues he has not been able to get this filled in a few weeks. Was prescribed Mounjaro 2.5 mg but hasn't picked it up yet.  -Failed Meds: Metformin - could not tolerate due to GI upset and diarrhea; Glipizide not taking  -Checking BG at home: high  -Eye exam: Referral placed, hasn't seen yet  -Foot exam: UTD 8/23 -Microalbumin: UTD 3/23 -Statin: No -PNA vaccine: No -Denies symptoms of hypoglycemia, polyuria, polydipsia, numbness extremities, foot ulcers/trauma.   Hypertension: -Medications: Lisinopril 2.5 mg -Checking BP at home (average): 140/90 average at home -Denies any SOB, CP, vision changes, LE edema or symptoms of hypotension  HLD: -Medications: None -Last lipid panel: Lipid Panel     Component Value Date/Time   CHOL 259 (H) 09/28/2022 0826   TRIG 643 (H) 09/28/2022 0826   HDL 42 09/28/2022 0826   CHOLHDL 6.2 (H) 09/28/2022 0826   LDLCALC  09/28/2022 0826     Comment:     . LDL cholesterol not calculated. Triglyceride levels greater than 400 mg/dL invalidate calculated LDL results. . Reference range: <100 . Desirable range <100 mg/dL for primary prevention;   <70 mg/dL for patients with CHD or diabetic patients  with > or = 2 CHD risk factors. Marland Kitchen LDL-C is now calculated using the Martin-Hopkins  calculation, which is a validated novel method providing  better accuracy than the Friedewald equation in the  estimation  of LDL-C.  Horald Pollen et al. Lenox Ahr. 4098;119(14): 2061-2068  (http://education.[facility].com/faq/FAQ164)    The 10-year ASCVD risk score (Arnett DK, et al., 2019) is: 29%   Values used to calculate the score:     Age: 57 years     Sex: Male     Is Non-Hispanic African American: No     Diabetic: Yes     Tobacco smoker: No     Systolic Blood Pressure: 144 mmHg     Is BP treated: Yes     HDL Cholesterol: 42 mg/dL     Total Cholesterol: 259 mg/dL  Health Maintenance: -Blood work UTD -Cologuard negative 9/23  Past Medical History:  Diagnosis Date   Back pain    Diabetes mellitus without complication (HCC)    Hypertension     No past surgical history on file.  Family History  Problem Relation Age of Onset   Cancer Mother        breast   Diabetes Mother    Heart disease Father     Social History   Socioeconomic History   Marital status: Single    Spouse name: Not on file   Number of children: Not on file   Years of education: Not on file   Highest education level: GED or equivalent  Occupational History   Not on file  Tobacco Use   Smoking status: Former    Types: Cigarettes    Quit date: 12/28/2018    Years since  quitting: 4.3   Smokeless tobacco: Never  Vaping Use   Vaping Use: Never used  Substance and Sexual Activity   Alcohol use: Yes    Alcohol/week: 12.0 standard drinks of alcohol    Types: 12 Cans of beer per week    Comment: per week   Drug use: Yes    Types: Marijuana   Sexual activity: Yes  Other Topics Concern   Not on file  Social History Narrative   Not on file   Social Determinants of Health   Financial Resource Strain: Low Risk  (05/02/2023)   Overall Financial Resource Strain (CARDIA)    Difficulty of Paying Living Expenses: Not hard at all  Food Insecurity: No Food Insecurity (05/02/2023)   Hunger Vital Sign    Worried About Running Out of Food in the Last Year: Never true    Ran Out of Food in the Last Year: Never true   Transportation Needs: No Transportation Needs (05/02/2023)   PRAPARE - Administrator, Civil Service (Medical): No    Lack of Transportation (Non-Medical): No  Physical Activity: Insufficiently Active (05/02/2023)   Exercise Vital Sign    Days of Exercise per Week: 1 day    Minutes of Exercise per Session: 20 min  Stress: No Stress Concern Present (05/02/2023)   [employer] of Occupational Health - Occupational Stress Questionnaire    Feeling of Stress : Only a little  Social Connections: Socially Isolated (05/02/2023)   Social Connection and Isolation Panel [NHANES]    Frequency of Communication with Friends and Family: Three times a week    Frequency of Social Gatherings with Friends and Family: Three times a week    Attends Religious Services: Never    Active Member of Clubs or Organizations: No    Attends Engineer, structural: Not on file    Marital Status: Never married  Intimate Partner Violence: Not on file    ROS Review of Systems  Constitutional:  Negative for chills and fever.  Eyes:  Negative for visual disturbance.  Respiratory:  Negative for cough and shortness of breath.   Cardiovascular:  Negative for chest pain.  Gastrointestinal:  Positive for abdominal pain. Negative for blood in stool, constipation, diarrhea, nausea and vomiting.  Skin:  Negative for rash.    Objective:   Today's Vitals: BP (!) 144/86   Pulse 73   Temp 98 F (36.7 C) (Oral)   Resp 16   Ht 5\' 9"  (1.753 m)   Wt 218 lb (98.9 kg)   SpO2 98%   BMI 32.19 kg/m   Physical Exam Constitutional:      Appearance: Normal appearance.  HENT:     Head: Normocephalic and atraumatic.  Eyes:     Conjunctiva/sclera: Conjunctivae normal.  Cardiovascular:     Rate and Rhythm: Normal rate and regular rhythm.  Pulmonary:     Effort: Pulmonary effort is normal.     Breath sounds: Normal breath sounds.  Skin:    General: Skin is warm and dry.     Comments: Flaky dry skin on face  and scalp  Neurological:     General: No focal deficit present.     Mental Status: He is alert. Mental status is at baseline.  Psychiatric:        Mood and Affect: Mood normal.        Behavior: Behavior normal.     Assessment & Plan:   1. Hypertension, unspecified type: Increase Lisinopril to 10  mg, will also prescribe Hydralazine to take as needed if BP >150/90. Follow up in 1 month.   - lisinopril (ZESTRIL) 10 MG tablet; Take 1 tablet (10 mg total) by mouth daily.  Dispense: 90 tablet; Refill: 1 - hydrALAZINE (APRESOLINE) 10 MG tablet; Take 1 tablet (10 mg total) by mouth daily as needed (take if BP >150/90).  Dispense: 30 tablet; Refill: 0  2. Type 2 diabetes mellitus with hyperglycemia, without long-term current use of insulin (HCC): He will pick up [facility] later today and start taking, will follow up in 1 month to recheck A1c and potentially increase Mounjaro.   3. Atopic dermatitis, unspecified type: Referral placed to Dermatology at patient request, will prescribe low dose steroid cream.  - hydrocortisone cream 0.5 %; Apply 1 Application topically 2 (two) times daily.  Dispense: 56 g; Refill: 0 - Ambulatory referral to Dermatology   Follow-up: Return in about 4 weeks (around 05/31/2023).   Margarita Mail, DO

## 2023-05-03 NOTE — Telephone Encounter (Signed)
Pt is calling check on the PA. Please advise CB- [phone]

## 2023-05-04 NOTE — Telephone Encounter (Signed)
Re submitted prior auth this am 05/04/23

## 2023-05-04 NOTE — Telephone Encounter (Signed)
Cover my meds states patient not found, so resubmitted prior auth as [employer]. Waiting on questions for response 05/04/23

## 2023-05-04 NOTE — Telephone Encounter (Signed)
Glynis Smiles I believe you are with with Dr. Caralee Ates this week, when I went back in at end of day to check cover my meds it states member not found, please verify patient insurance? Or forward to whoever is working with Dr. Caralee Ates, he is a diabetic and will need medication.

## 2023-05-08 ENCOUNTER — Ambulatory Visit: Payer: BC Managed Care – PPO | Admitting: Gastroenterology

## 2023-05-08 ENCOUNTER — Encounter: Payer: Self-pay | Admitting: Gastroenterology

## 2023-05-08 VITALS — BP 178/90 | HR 82 | Temp 98.0°F | Ht 68.0 in | Wt 216.0 lb

## 2023-05-08 DIAGNOSIS — R194 Change in bowel habit: Secondary | ICD-10-CM | POA: Diagnosis not present

## 2023-05-08 DIAGNOSIS — K760 Fatty (change of) liver, not elsewhere classified: Secondary | ICD-10-CM

## 2023-05-08 MED ORDER — NA SULFATE-K SULFATE-MG SULF 17.5-3.13-1.6 GM/177ML PO SOLN: 1.0000 | Freq: Once | ORAL | 0 refills | Status: AC

## 2023-05-08 NOTE — Progress Notes (Signed)
Gastroenterology Consultation  Referring Provider:     Margarita Mail, DO Primary Care Physician:  Margarita Mail, DO Primary Gastroenterologist:  Dr. Servando Snare     Reason for Consultation:     Fatty liver        HPI:   Colin Brooks is a 57 y.o. y/o male referred for consultation & management of fatty liver by Dr. Margarita Mail, DO.  This patient comes in today after being seen by his primary care provider for left lower quadrant pain.  The patient had a CT scan of the abdomen that at that time that showed nothing to explain the abdominal pain but did show fatty liver.  It appears that the patient's last liver enzymes were in February of 2023 and at that time liver enzymes were normal.  Patient's hepatitis C antibody was shown to be negative in the past.  The patient says that he has abdominal pain on both the right and left side of his abdomen going down towards his pubic bone.  The patient states that he has noticed he has urgency when it comes to moving his bowels.  He denies any diarrhea when he has the urgency.  He states that he did research and believes he may have ulcerative colitis.  He denies any unexplained weight loss fevers chills black stools or bloody stools.  The patient also denies any family history of inflammatory bowel disease.  His abdominal pain is greatly improved with gabapentin and Celebrex.  Past Medical History:  Diagnosis Date   Back pain    Diabetes mellitus without complication (HCC)    Hypertension     No past surgical history on file.  Prior to Admission medications   Medication Sig Start Date End Date Taking? Authorizing Provider  celecoxib (CELEBREX) 100 MG capsule Take 1 capsule (100 mg total) by mouth 2 (two) times daily. 01/23/23   Margarita Mail, DO  gabapentin (NEURONTIN) 300 MG capsule TAKE 1 CAPSULE BY MOUTH THREE TIMES DAILY 02/22/23   Margarita Mail, DO  hydrALAZINE (APRESOLINE) 10 MG tablet Take 1 tablet (10 mg total) by mouth  daily as needed (take if BP >150/90). 05/03/23   Margarita Mail, DO  hydrocortisone cream 0.5 % Apply 1 Application topically 2 (two) times daily. 05/03/23   Margarita Mail, DO  lisinopril (ZESTRIL) 10 MG tablet Take 1 tablet (10 mg total) by mouth daily. 05/03/23   Margarita Mail, DO  tirzepatide [facility]) 2.5 MG/0.5ML Pen Inject 2.5 mg into the skin once a week. 04/30/23   Margarita Mail, DO    Family History  Problem Relation Age of Onset   Cancer Mother        breast   Diabetes Mother    Heart disease Father      Social History   Tobacco Use   Smoking status: Former    Types: Cigarettes    Quit date: 12/28/2018    Years since quitting: 4.3   Smokeless tobacco: Never  Vaping Use   Vaping Use: Never used  Substance Use Topics   Alcohol use: Yes    Alcohol/week: 12.0 standard drinks of alcohol    Types: 12 Cans of beer per week    Comment: per week   Drug use: Yes    Types: Marijuana    Allergies as of 05/08/2023   (No Known Allergies)    Review of Systems:    All systems reviewed and negative except where noted in HPI.   Physical Exam:  There were  no vitals taken for this visit. No LMP for male patient. General:   Alert,  Well-developed, well-nourished, pleasant and cooperative in NAD Head:  Normocephalic and atraumatic. Eyes:  Sclera clear, no icterus.   Conjunctiva pink. Ears:  Normal auditory acuity. Neck:  Supple; no masses or thyromegaly. Lungs:  Respirations even and unlabored.  Clear throughout to auscultation.   No wheezes, crackles, or rhonchi. No acute distress. Heart:  Regular rate and rhythm; no murmurs, clicks, rubs, or gallops. Abdomen:  Normal bowel sounds.  No bruits.  Soft, non-tender and non-distended without masses, hepatosplenomegaly or hernias noted.  No guarding or rebound tenderness.  Negative Carnett sign.   Rectal:  Deferred.  Pulses:  Normal pulses noted. Extremities:  No clubbing or edema.  No cyanosis. Neurologic:  Alert and  oriented x3;  grossly normal neurologically. Skin:  Intact without significant lesions or rashes.  No jaundice. Lymph Nodes:  No significant cervical adenopathy. Psych:  Alert and cooperative. Normal mood and affect.  Imaging Studies: No results found.  Assessment and Plan:   Colin Brooks is a 57 y.o. y/o male who comes in today with a history of lower abdominal pain and fatty liver on imaging.  The patient's liver enzymes have been normal in the past.  The patient will have blood sent off for a FIB-4 for evaluating his fibrosis.  The patient will also be set up for colonoscopy due to his change in bowel habits.  The patient has been told that his symptoms are more consistent with irritable bowel than it is with inflammatory bowel disease but due to his concern and abdominal pain he will undergo a colonoscopy.  The patient has been explained the plan and agrees with it.    Midge Minium, MD. Clementeen Graham    Note: This dictation was prepared with Dragon dictation along with smaller phrase technology. Any transcriptional errors that result from this process are unintentional.

## 2023-05-09 LAB — NASH FIBROSURE(R) PLUS

## 2023-05-11 NOTE — Telephone Encounter (Signed)
New one initiated

## 2023-05-15 LAB — NASH FIBROSURE(R) PLUS
ALPHA 2-MACROGLOBULINS, QN: 106 mg/dL — ABNORMAL LOW (ref 110–276)
ALT (SGPT) P5P: 45 IU/L (ref 0–55)
AST (SGOT) P5P: 44 IU/L — ABNORMAL HIGH (ref 0–40)
Cholesterol, Total: 267 mg/dL — ABNORMAL HIGH (ref 100–199)
GGT: 94 IU/L — ABNORMAL HIGH (ref 0–65)
Glucose: 142 mg/dL — ABNORMAL HIGH (ref 70–99)
Haptoglobin: 141 mg/dL (ref 29–370)
NASH Score: 0.55 — ABNORMAL HIGH (ref 0.00–0.25)
Steatosis Score: 0.92 — ABNORMAL HIGH (ref 0.00–0.40)
Triglycerides: 486 mg/dL — ABNORMAL HIGH (ref 0–149)

## 2023-05-15 LAB — FIB-4 W/RX NASH FIBROSURE PLUS
ALT: 39 IU/L (ref 0–44)
AST: 37 IU/L (ref 0–40)
FIB-4 Index: 1.63 (ref 0.00–2.67)
Platelets: 215 10*3/uL (ref 150–450)

## 2023-05-18 ENCOUNTER — Encounter: Payer: Self-pay | Admitting: Gastroenterology

## 2023-05-21 DIAGNOSIS — L218 Other seborrheic dermatitis: Secondary | ICD-10-CM | POA: Diagnosis not present

## 2023-05-21 DIAGNOSIS — L400 Psoriasis vulgaris: Secondary | ICD-10-CM | POA: Diagnosis not present

## 2023-06-04 ENCOUNTER — Encounter: Payer: Self-pay | Admitting: Family Medicine

## 2023-06-04 ENCOUNTER — Ambulatory Visit: Payer: BC Managed Care – PPO | Admitting: Family Medicine

## 2023-06-04 VITALS — BP 156/84 | HR 75 | Temp 98.0°F | Resp 16 | Ht 68.0 in | Wt 211.2 lb

## 2023-06-04 DIAGNOSIS — E782 Mixed hyperlipidemia: Secondary | ICD-10-CM | POA: Diagnosis not present

## 2023-06-04 DIAGNOSIS — E1165 Type 2 diabetes mellitus with hyperglycemia: Secondary | ICD-10-CM | POA: Diagnosis not present

## 2023-06-04 DIAGNOSIS — I10 Essential (primary) hypertension: Secondary | ICD-10-CM

## 2023-06-04 DIAGNOSIS — B0229 Other postherpetic nervous system involvement: Secondary | ICD-10-CM

## 2023-06-04 DIAGNOSIS — K760 Fatty (change of) liver, not elsewhere classified: Secondary | ICD-10-CM | POA: Diagnosis not present

## 2023-06-04 DIAGNOSIS — Z5181 Encounter for therapeutic drug level monitoring: Secondary | ICD-10-CM

## 2023-06-04 LAB — CBC WITH DIFFERENTIAL/PLATELET
Basophils Absolute: 48 cells/uL (ref 0–200)
Basophils Relative: 0.8 %
Eosinophils Relative: 2.6 %
MCH: 32.9 pg (ref 27.0–33.0)
MCHC: 34.4 g/dL (ref 32.0–36.0)
MCV: 95.7 fL (ref 80.0–100.0)
Total Lymphocyte: 15.7 %

## 2023-06-04 MED ORDER — GABAPENTIN 300 MG PO CAPS
300.0000 mg | ORAL_CAPSULE | Freq: Three times a day (TID) | ORAL | 0 refills | Status: AC
Start: 2023-06-04 — End: ?

## 2023-06-04 MED ORDER — OZEMPIC (0.25 OR 0.5 MG/DOSE) 2 MG/3ML ~~LOC~~ SOPN: 0.5000 mg | PEN_INJECTOR | SUBCUTANEOUS | 0 refills | Status: AC

## 2023-06-04 NOTE — Progress Notes (Unsigned)
Name: Colin Brooks   MRN: [id number]    DOB: 06/03/1966   Date:06/04/2023       Progress Note  Chief Complaint  Patient presents with   Follow-up   Hypertension   Diabetes    Pt believes have SE from [facility] which are: muscle and joint pain, missed last week dose due to SE     Subjective:   Colin Brooks is a 57 y.o. male, presents to clinic for 1 month f/up - he is est with Dr.Andrews, new to me   HTN recheck here today, lisinopril 10 mg done Home BP reading are still a little elevated in the am 127/71, 121/76 readings in the evening 122/69, 118/75 Changed lisinopril dose from 2.5 mg to 10 mg a month ago He is tolerating the dose change BP Readings from Last 3 Encounters:  06/04/23 (!) 156/84  05/08/23 (!) 178/90  05/03/23 (!) 144/86     DM- uncontrolled, he did not tolerate mounjaro - lowest dose x 3 weeks made him feel very sick He changed to this due to trulicity being unavailable- he tolerated trulicity w/o any concerns, was on 1.5 mg dose weekly Lab Results  Component Value Date   HGBA1C 8.2 (A) 10/24/2022  A1c last done November and is uncontrolled He is willing to try other GLP-1 Due for DM eye exam and labs         Current Outpatient Medications:    celecoxib (CELEBREX) 100 MG capsule, Take 1 capsule (100 mg total) by mouth 2 (two) times daily., Disp: 180 capsule, Rfl: 1   gabapentin (NEURONTIN) 300 MG capsule, TAKE 1 CAPSULE BY MOUTH THREE TIMES DAILY, Disp: 270 capsule, Rfl: 0   hydrocortisone cream 0.5 %, Apply 1 Application topically 2 (two) times daily., Disp: 56 g, Rfl: 0   ketoconazole (NIZORAL) 2 % shampoo, Apply topically 3 (three) times a week., Disp: , Rfl:    lisinopril (ZESTRIL) 10 MG tablet, Take 1 tablet (10 mg total) by mouth daily., Disp: 90 tablet, Rfl: 1   milk thistle 175 MG tablet, Take 175 mg by mouth daily., Disp: , Rfl:    triamcinolone (KENALOG) 0.025 % cream, Apply topically 2 (two) times daily., Disp: , Rfl:    Vitamin D,  Cholecalciferol, 10 MCG (400 UNIT) CAPS, Take by mouth., Disp: , Rfl:    Na Sulfate-K Sulfate-Mg Sulf 17.5-3.13-1.6 GM/177ML SOLN, See admin instructions. (Patient not taking: Reported on 06/04/2023), Disp: , Rfl:    tirzepatide (MOUNJARO) 2.5 MG/0.5ML Pen, Inject 2.5 mg into the skin once a week. (Patient not taking: Reported on 06/04/2023), Disp: 2 mL, Rfl: 0  Patient Active Problem List   Diagnosis Date Noted   Chest pain 07/04/2022   Mixed hyperlipidemia 02/21/2022   Type 2 diabetes mellitus with hyperglycemia, without long-term current use of insulin (HCC) 02/17/2022   Hypertension 02/17/2022   Alcohol abuse 04/05/2014   Liver laceration 04/05/2014   Pneumothorax 04/05/2014   Rib fractures 04/05/2014    No past surgical history on file.  Family History  Problem Relation Age of Onset   Cancer Mother        breast   Diabetes Mother    Heart disease Father     Social History   Tobacco Use   Smoking status: Former    Types: Cigarettes    Quit date: 12/28/2018    Years since quitting: 4.4   Smokeless tobacco: Never  Vaping Use   Vaping Use: Never used  Substance Use Topics  Alcohol use: Yes    Alcohol/week: 12.0 standard drinks of alcohol    Types: 12 Cans of beer per week    Comment: per week   Drug use: Yes    Types: Marijuana     No Known Allergies  Health Maintenance  Topic Date Due   OPHTHALMOLOGY EXAM  Never done   Diabetic kidney evaluation - Urine ACR  02/18/2023   HEMOGLOBIN A1C  04/24/2023   COVID-19 Vaccine (1) 06/20/2023 (Originally 10/22/1966)   Zoster Vaccines- Shingrix (1 of 2) 09/04/2023 (Originally 04/21/2014)   INFLUENZA VACCINE  06/28/2023   FOOT EXAM  07/25/2023   Diabetic kidney evaluation - eGFR measurement  09/29/2023   DTaP/Tdap/Td (2 - Td or Tdap) 02/18/2032   Colonoscopy  08/24/2032   Hepatitis C Screening  Completed   HIV Screening  Completed   HPV VACCINES  Aged Out    Chart Review Today: I personally reviewed active problem list,  medication list, allergies, family history, social history, health maintenance, notes from last encounter, lab results, imaging with the patient/caregiver today.   Review of Systems  Constitutional: Negative.   HENT: Negative.    Eyes: Negative.   Respiratory: Negative.    Cardiovascular: Negative.   Gastrointestinal: Negative.   Endocrine: Negative.   Genitourinary: Negative.   Musculoskeletal: Negative.   Skin: Negative.   Allergic/Immunologic: Negative.   Neurological: Negative.   Hematological: Negative.   Psychiatric/Behavioral: Negative.    All other systems reviewed and are negative.    Objective:   Vitals:   06/04/23 0919 06/04/23 0928  BP: (!) 158/90 (!) 156/84  Pulse: 75   Resp: 16   Temp: 98 F (36.7 C)   TempSrc: Oral   SpO2: 98%   Weight: 211 lb 3.2 oz (95.8 kg)   Height: 5\' 8"  (1.727 m)     Body mass index is 32.11 kg/m. Wt Readings from Last 5 Encounters:  06/04/23 211 lb 3.2 oz (95.8 kg)  05/08/23 216 lb (98 kg)  05/03/23 218 lb (98.9 kg)  01/23/23 222 lb 14.4 oz (101.1 kg)  10/24/22 221 lb 11.2 oz (100.6 kg)   BMI Readings from Last 5 Encounters:  06/04/23 32.11 kg/m  05/08/23 32.84 kg/m  05/03/23 32.19 kg/m  01/23/23 32.92 kg/m  10/24/22 32.74 kg/m     Physical Exam Vitals and nursing note reviewed.  Constitutional:      General: He is not in acute distress.    Appearance: Normal appearance. He is well-developed and well-groomed. He is not ill-appearing, toxic-appearing or diaphoretic.  HENT:     Head: Normocephalic and atraumatic.     Nose: Nose normal.  Eyes:     General:        Right eye: No discharge.        Left eye: No discharge.     Conjunctiva/sclera: Conjunctivae normal.  Neck:     Trachea: No tracheal deviation.  Cardiovascular:     Rate and Rhythm: Normal rate and regular rhythm.  Pulmonary:     Effort: Pulmonary effort is normal. No respiratory distress.     Breath sounds: No stridor.  Musculoskeletal:         General: Normal range of motion.  Skin:    General: Skin is warm and dry.     Findings: No rash.  Neurological:     Mental Status: He is alert.     Motor: No abnormal muscle tone.     Coordination: Coordination normal.  Psychiatric:  Behavior: Behavior normal. Behavior is cooperative.         Assessment & Plan:   Problem List Items Addressed This Visit       Cardiovascular and Mediastinum   Hypertension - Primary    BP in clinic remains elevated and not at goal, but his home BP readings are 110-120/60-70's His PCP recently increased lisinopril dose from 2.5 to 10 mg, which she endorses good compliance with and no side effects or concerns  With good home BP readings, will keep med dose the same and have him continue to monitor at home and come in to the office again for BP recheck - bring in readings/cuff to compare      Relevant Orders   COMPLETE METABOLIC PANEL WITH GFR (Completed)     Endocrine   Type 2 diabetes mellitus with hyperglycemia, without long-term current use of insulin (HCC)    A1C uncontrolled, pt was previously on metformin, but hesitant about that medicine, also tried glipizide and then started trulicity which he did well with. Trulicity was recently unavailable so his PCP switched him to the lowest dose [facility] He took a 0.25 mg dose of Mounjaro for 3 weeks and had severe side effects felt like he had the flu and body aches, he held the last dose and is here to discuss that today He would be willing to try Ozempic or other GLP-1's but does not believe he will tolerate Mounjaro Last A1c was 8.2 about 8 months ago prior to that was even higher around 9.1 is overdue for labs and diabetic eye exam We have discussed and decided on trying Ozempic we will have him start at the 0.50 dose (would likely be equivalent with his prior 1.5 mg Trulicity dose) if he has intolerable side effects he can decrease the dose to 0.25 Recommend a 5-month follow-up with his  PCP for recheck of chronic conditions he was of course encouraged to come sooner if he had other concerns or intolerable medicine side effects      Relevant Medications   Semaglutide,0.25 or 0.5MG /DOS, (OZEMPIC, 0.25 OR 0.5 MG/DOSE,) 2 MG/3ML SOPN   Other Relevant Orders   Hemoglobin A1c (Completed)   Ambulatory referral to Ophthalmology   Microalbumin / creatinine urine ratio (Completed)   COMPLETE METABOLIC PANEL WITH GFR (Completed)     Other   Mixed hyperlipidemia    Patient has very elevated ASCVD score and previously refused statins Lab Results  Component Value Date   CHOL 267 (H) 05/08/2023   HDL 42 09/28/2022   LDLCALC  09/28/2022     Comment:     . LDL cholesterol not calculated. Triglyceride levels greater than 400 mg/dL invalidate calculated LDL results. . Reference range: <100 . Desirable range <100 mg/dL for primary prevention;   <70 mg/dL for patients with CHD or diabetic patients  with > or = 2 CHD risk factors. Marland Kitchen LDL-C is now calculated using the Martin-Hopkins  calculation, which is a validated novel method providing  better accuracy than the Friedewald equation in the  estimation of LDL-C.  Horald Pollen et al. Lenox Ahr. 2703;500(93): 2061-2068  (http://education.[facility].com/faq/FAQ164)    TRIG 486 (H) 05/08/2023   CHOLHDL 6.2 (H) 09/28/2022  .  Elevated cholesterol and fatty liver disease which would likely improve with managing and improving his hyperlipidemia and diabetes His labs were recently done with his PCP and will not be redone today       Relevant Orders   COMPLETE METABOLIC PANEL WITH GFR (Completed)  Other Visit Diagnoses     Post herpetic neuralgia       He asked for refill on his gabapentin   Relevant Medications   gabapentin (NEURONTIN) 300 MG capsule   Fatty liver       Monitoring LFTs, decreasing EtOH, avoiding processed foods, losing some weight, and improving cholesterol and diabetes would likely improve fatty liver disease    Relevant Orders   COMPLETE METABOLIC PANEL WITH GFR (Completed)   Encounter for medication monitoring       Relevant Medications   Semaglutide,0.25 or 0.5MG /DOS, (OZEMPIC, 0.25 OR 0.5 MG/DOSE,) 2 MG/3ML SOPN   Other Relevant Orders   Hemoglobin A1c (Completed)   COMPLETE METABOLIC PANEL WITH GFR (Completed)   CBC with Differential/Platelet (Completed)         SE with mounjaro x 3 doses, trulicity out of stock, will try ozempic  Return in about 3 months (around 09/04/2023) for Routine follow-up PCP DM, HTN.   Danelle Berry, PA-C 06/04/23 9:31 AM

## 2023-06-05 LAB — CBC WITH DIFFERENTIAL/PLATELET
Absolute Monocytes: 966 cells/uL — ABNORMAL HIGH (ref 200–950)
Eosinophils Absolute: 156 cells/uL (ref 15–500)
HCT: 46.5 % (ref 38.5–50.0)
Hemoglobin: 16 g/dL (ref 13.2–17.1)
Lymphs Abs: 942 cells/uL (ref 850–3900)
MPV: 9.8 fL (ref 7.5–12.5)
Monocytes Relative: 16.1 %
Neutro Abs: 3888 cells/uL (ref 1500–7800)
Neutrophils Relative %: 64.8 %
Platelets: 224 10*3/uL (ref 140–400)
RBC: 4.86 10*6/uL (ref 4.20–5.80)
RDW: 12.2 % (ref 11.0–15.0)
WBC: 6 10*3/uL (ref 3.8–10.8)

## 2023-06-05 LAB — HEMOGLOBIN A1C
Hgb A1c MFr Bld: 7.8 % of total Hgb — ABNORMAL HIGH (ref <5.7)
Mean Plasma Glucose: 177 mg/dL
eAG (mmol/L): 9.8 mmol/L

## 2023-06-05 LAB — MICROALBUMIN / CREATININE URINE RATIO
Creatinine, Urine: 202 mg/dL (ref 20–320)
Microalb Creat Ratio: 13 mg/g creat (ref <30)
Microalb, Ur: 2.7 mg/dL

## 2023-06-05 LAB — COMPLETE METABOLIC PANEL WITH GFR
AG Ratio: 1.5 (calc) (ref 1.0–2.5)
ALT: 33 U/L (ref 9–46)
AST: 26 U/L (ref 10–35)
Albumin: 4.3 g/dL (ref 3.6–5.1)
Alkaline phosphatase (APISO): 53 U/L (ref 35–144)
BUN: 12 mg/dL (ref 7–25)
CO2: 29 mmol/L (ref 20–32)
Calcium: 9.6 mg/dL (ref 8.6–10.3)
Chloride: 106 mmol/L (ref 98–110)
Creat: 0.86 mg/dL (ref 0.70–1.30)
Globulin: 2.8 g/dL (calc) (ref 1.9–3.7)
Glucose, Bld: 159 mg/dL — ABNORMAL HIGH (ref 65–139)
Potassium: 5.1 mmol/L (ref 3.5–5.3)
Sodium: 141 mmol/L (ref 135–146)
Total Bilirubin: 0.7 mg/dL (ref 0.2–1.2)
Total Protein: 7.1 g/dL (ref 6.1–8.1)
eGFR: 100 mL/min/{1.73_m2} (ref >=60)

## 2023-06-06 ENCOUNTER — Encounter: Payer: Self-pay | Admitting: Gastroenterology

## 2023-06-07 ENCOUNTER — Encounter: Payer: Self-pay | Admitting: Family Medicine

## 2023-06-07 NOTE — Assessment & Plan Note (Signed)
Patient has very elevated ASCVD score and previously refused statins Lab Results  Component Value Date   CHOL 267 (H) 05/08/2023   HDL 42 09/28/2022   LDLCALC  09/28/2022     Comment:     . LDL cholesterol not calculated. Triglyceride levels greater than 400 mg/dL invalidate calculated LDL results. . Reference range: <100 . Desirable range <100 mg/dL for primary prevention;   <70 mg/dL for patients with CHD or diabetic patients  with > or = 2 CHD risk factors. Marland Kitchen LDL-C is now calculated using the Martin-Hopkins  calculation, which is a validated novel method providing  better accuracy than the Friedewald equation in the  estimation of LDL-C.  Horald Pollen et al. Lenox Ahr. 1610;960(45): 2061-2068  (http://education.[facility].com/faq/FAQ164)    TRIG 486 (H) 05/08/2023   CHOLHDL 6.2 (H) 09/28/2022  .  Elevated cholesterol and fatty liver disease which would likely improve with managing and improving his hyperlipidemia and diabetes His labs were recently done with his PCP and will not be redone today

## 2023-06-07 NOTE — Assessment & Plan Note (Addendum)
BP in clinic remains elevated and not at goal, but his home BP readings are 110-120/60-70's His PCP recently increased lisinopril dose from 2.5 to 10 mg, which she endorses good compliance with and no side effects or concerns  With good home BP readings, will keep med dose the same and have him continue to monitor at home and come in to the office again for BP recheck - bring in readings/cuff to compare

## 2023-06-07 NOTE — Assessment & Plan Note (Addendum)
A1C uncontrolled, pt was previously on metformin, but hesitant about that medicine, also tried glipizide and then started trulicity which he did well with. Trulicity was recently unavailable so his PCP switched him to the lowest dose Mounjaro He took a 0.25 mg dose of Mounjaro for 3 weeks and had severe side effects felt like he had the flu and body aches, he held the last dose and is here to discuss that today He would be willing to try Ozempic or other GLP-1's but does not believe he will tolerate Mounjaro Last A1c was 8.2 about 8 months ago prior to that was even higher around 9.1 is overdue for labs and diabetic eye exam We have discussed and decided on trying Ozempic we will have him start at the 0.50 dose (would likely be equivalent with his prior 1.5 mg Trulicity dose) if he has intolerable side effects he can decrease the dose to 0.25 Recommend a 46-month follow-up with his PCP for recheck of chronic conditions he was of course encouraged to come sooner if he had other concerns or intolerable medicine side effects

## 2023-06-12 DIAGNOSIS — S01311A Laceration without foreign body of right ear, initial encounter: Secondary | ICD-10-CM | POA: Diagnosis not present

## 2023-06-12 DIAGNOSIS — S01319A Laceration without foreign body of unspecified ear, initial encounter: Secondary | ICD-10-CM | POA: Diagnosis not present

## 2023-07-10 ENCOUNTER — Telehealth: Payer: Self-pay

## 2023-07-10 NOTE — Telephone Encounter (Signed)
This patient canceled his procedure for 07-12-23 with Dr Servando Snare.  He is working out of town and unable to have procedure and he will call your office to reschedule

## 2023-07-12 ENCOUNTER — Encounter: Admission: RE | Payer: Self-pay | Source: Home / Self Care

## 2023-07-12 ENCOUNTER — Ambulatory Visit
Admission: RE | Admit: 2023-07-12 | Payer: BC Managed Care – PPO | Source: Home / Self Care | Admitting: Gastroenterology

## 2023-07-12 SURGERY — COLONOSCOPY WITH PROPOFOL
Anesthesia: General

## 2023-07-19 ENCOUNTER — Other Ambulatory Visit: Payer: Self-pay | Admitting: Internal Medicine

## 2023-07-19 DIAGNOSIS — B0229 Other postherpetic nervous system involvement: Secondary | ICD-10-CM

## 2023-07-19 NOTE — Telephone Encounter (Signed)
Requested Prescriptions  Pending Prescriptions Disp Refills   celecoxib (CELEBREX) 100 MG capsule [Pharmacy Med Name: Celecoxib 100 MG Oral Capsule] 180 capsule 0    Sig: Take 1 capsule by mouth twice daily     Analgesics:  COX2 Inhibitors Failed - 07/19/2023  6:52 AM      Failed - Manual Review: Labs are only required if the patient has taken medication for more than 8 weeks.      Passed - HGB in normal range and within 360 days    Hemoglobin  Date Value Ref Range Status  06/04/2023 16.0 13.2 - 17.1 g/dL Final         Passed - Cr in normal range and within 360 days    Creat  Date Value Ref Range Status  06/04/2023 0.86 0.70 - 1.30 mg/dL Final   Creatinine, Urine  Date Value Ref Range Status  06/04/2023 202 20 - 320 mg/dL Final         Passed - HCT in normal range and within 360 days    HCT  Date Value Ref Range Status  06/04/2023 46.5 38.5 - 50.0 % Final         Passed - AST in normal range and within 360 days    AST  Date Value Ref Range Status  06/04/2023 26 10 - 35 U/L Final         Passed - ALT in normal range and within 360 days    ALT  Date Value Ref Range Status  06/04/2023 33 9 - 46 U/L Final   ALT (SGPT) P5P  Date Value Ref Range Status  05/08/2023 45 0 - 55 IU/L Final         Passed - eGFR is 30 or above and within 360 days    GFR, Estimated  Date Value Ref Range Status  07/04/2022 >60 >60 mL/min Final    Comment:    (NOTE) Calculated using the CKD-EPI Creatinine Equation (2021)    eGFR  Date Value Ref Range Status  06/04/2023 100 > OR = 60 mL/min/1.20m2 Final         Passed - Patient is not pregnant      Passed - Valid encounter within last 12 months    Recent Outpatient Visits           1 month ago Hypertension, unspecified type   [facility] Danelle Berry, PA-C   2 months ago Hypertension, unspecified type   [facility] Margarita Mail, DO   5 months ago Left lower quadrant  abdominal pain   [facility] Margarita Mail, DO   8 months ago Type 2 diabetes mellitus with hyperglycemia, without long-term current use of insulin [facility])   [facility] Margarita Mail, DO   9 months ago Hypertension, unspecified type   [facility] Margarita Mail, DO       Future Appointments             In 1 month Margarita Mail, DO [facility], [facility]

## 2023-07-27 ENCOUNTER — Other Ambulatory Visit: Payer: Self-pay | Admitting: Internal Medicine

## 2023-07-27 DIAGNOSIS — B0229 Other postherpetic nervous system involvement: Secondary | ICD-10-CM

## 2023-07-27 NOTE — Telephone Encounter (Signed)
Requested Prescriptions  Refused Prescriptions Disp Refills   gabapentin (NEURONTIN) 300 MG capsule 270 capsule 0    Sig: Take 1 capsule (300 mg total) by mouth 3 (three) times daily.     Neurology: Anticonvulsants - gabapentin Passed - 07/27/2023  2:10 PM      Passed - Cr in normal range and within 360 days    Creat  Date Value Ref Range Status  06/04/2023 0.86 0.70 - 1.30 mg/dL Final   Creatinine, Urine  Date Value Ref Range Status  06/04/2023 202 20 - 320 mg/dL Final         Passed - Completed PHQ-2 or PHQ-9 in the last 360 days      Passed - Valid encounter within last 12 months    Recent Outpatient Visits           1 month ago Hypertension, unspecified type   [facility] Danelle Berry, PA-C   2 months ago Hypertension, unspecified type   [facility] Margarita Mail, DO   6 months ago Left lower quadrant abdominal pain   [facility] Margarita Mail, DO   9 months ago Type 2 diabetes mellitus with hyperglycemia, without long-term current use of insulin [facility])   [facility] Margarita Mail, DO   10 months ago Hypertension, unspecified type   [facility] Margarita Mail, DO       Future Appointments             In 1 month Margarita Mail, DO [facility], [facility]

## 2023-07-27 NOTE — Telephone Encounter (Signed)
Medication Refill - Medication: gabapentin (NEURONTIN) 300 MG capsule [[id number]]   Has the patient contacted their pharmacy? Yes.   (Agent: If no, request that the patient contact the pharmacy for the refill. If patient does not wish to contact the pharmacy document the reason why and proceed with request.) (Agent: If yes, when and what did the pharmacy advise?)  Preferred Pharmacy (with phone number or street name): [facility] [address] [place]), [place] - [address] Phone: [phone]  Fax: [phone]   Has the patient been seen for an appointment in the last year OR does the patient have an upcoming appointment? Yes.    Agent: Please be advised that RX refills may take up to 3 business days. We ask that you follow-up with your pharmacy.

## 2023-07-27 NOTE — Telephone Encounter (Signed)
Called pharmacy - there is a refill on file. Pharmacy will prepare medication.

## 2023-08-14 IMAGING — CR DG CHEST 2V
1 series · 2 of 2 positions shown · non-contrast
Comparison: None.

CLINICAL DATA: Chest pain.

EXAM:
CHEST - 2 VIEW

[Series 1: dg chest 2 view · 0.14mm/px · 2 of 2 slices shown]
[im 1/2]
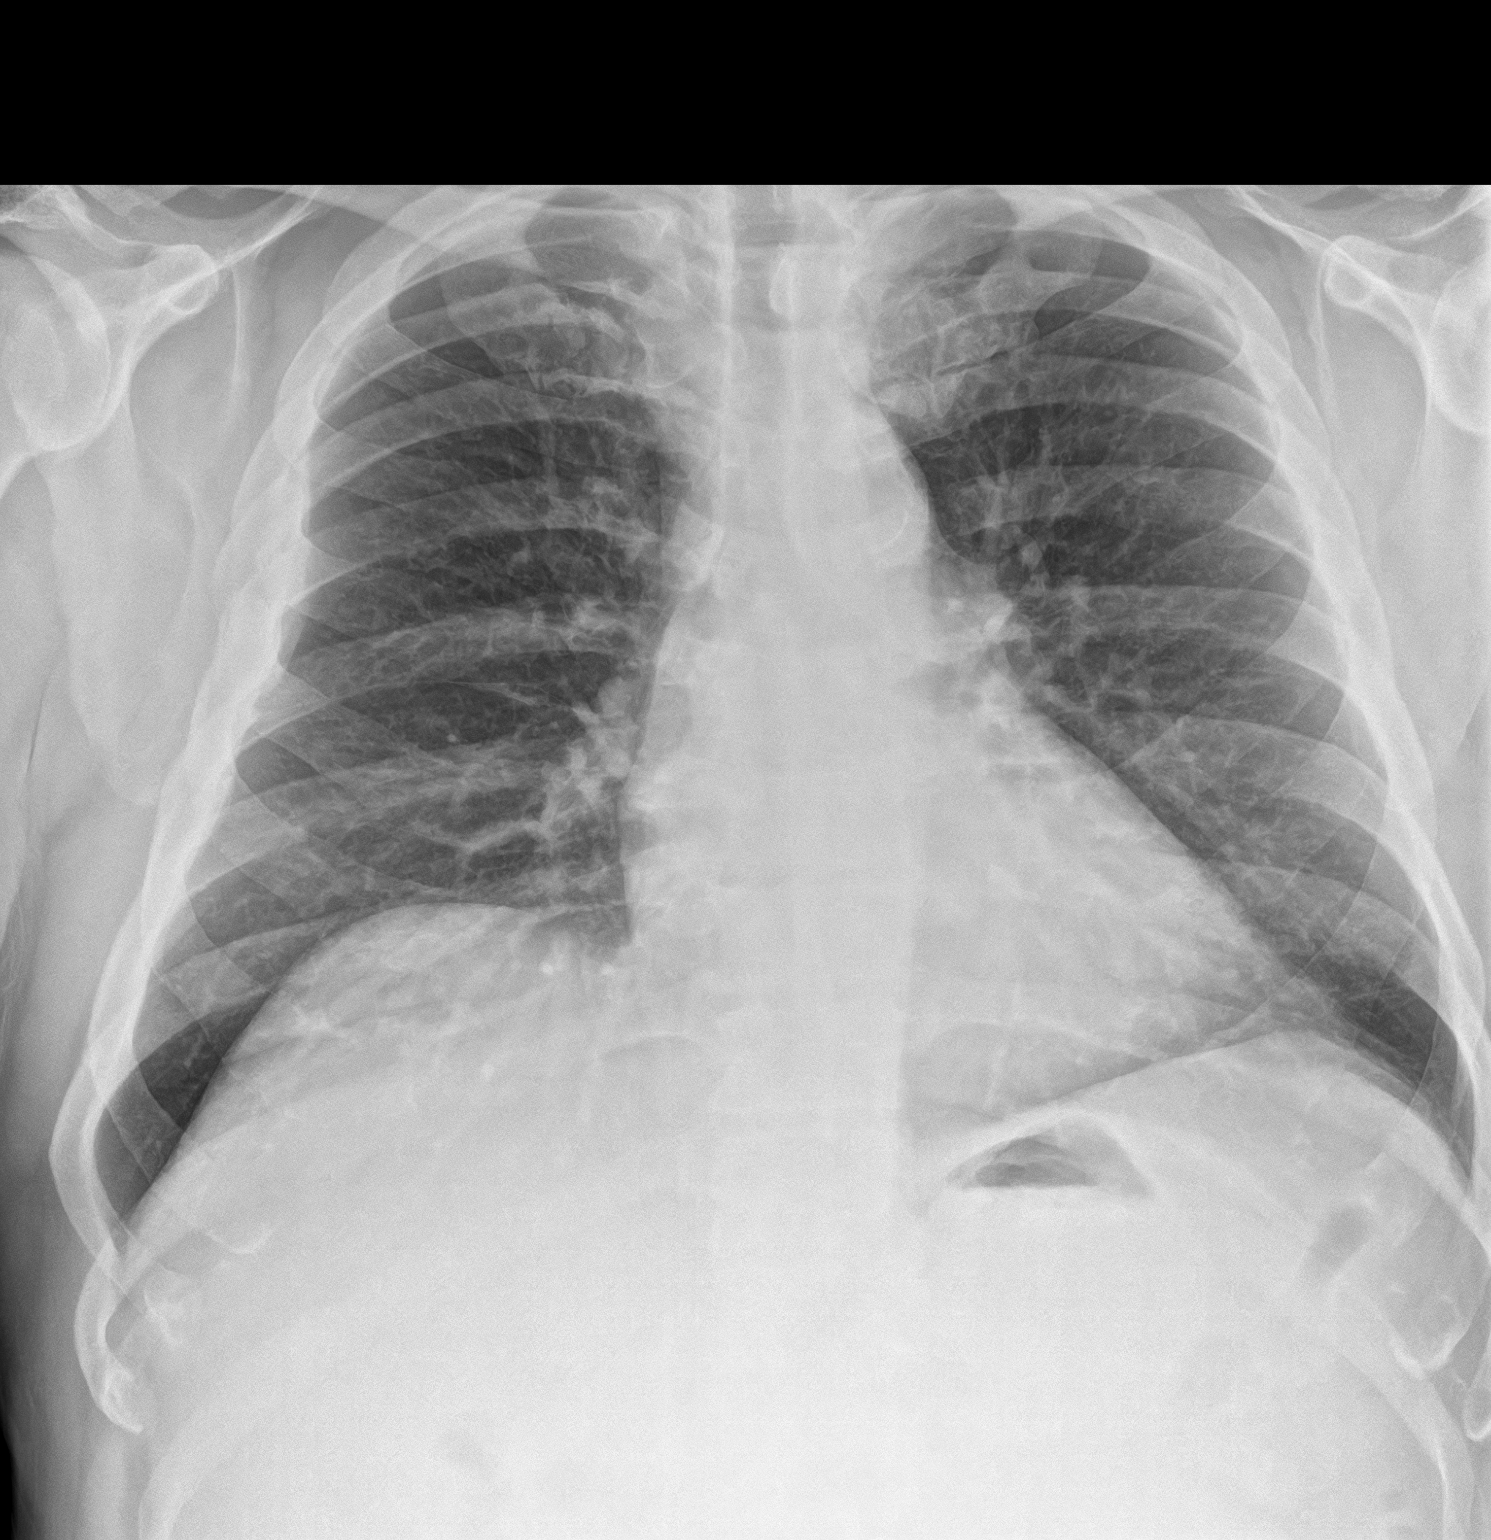
[im 2/2]
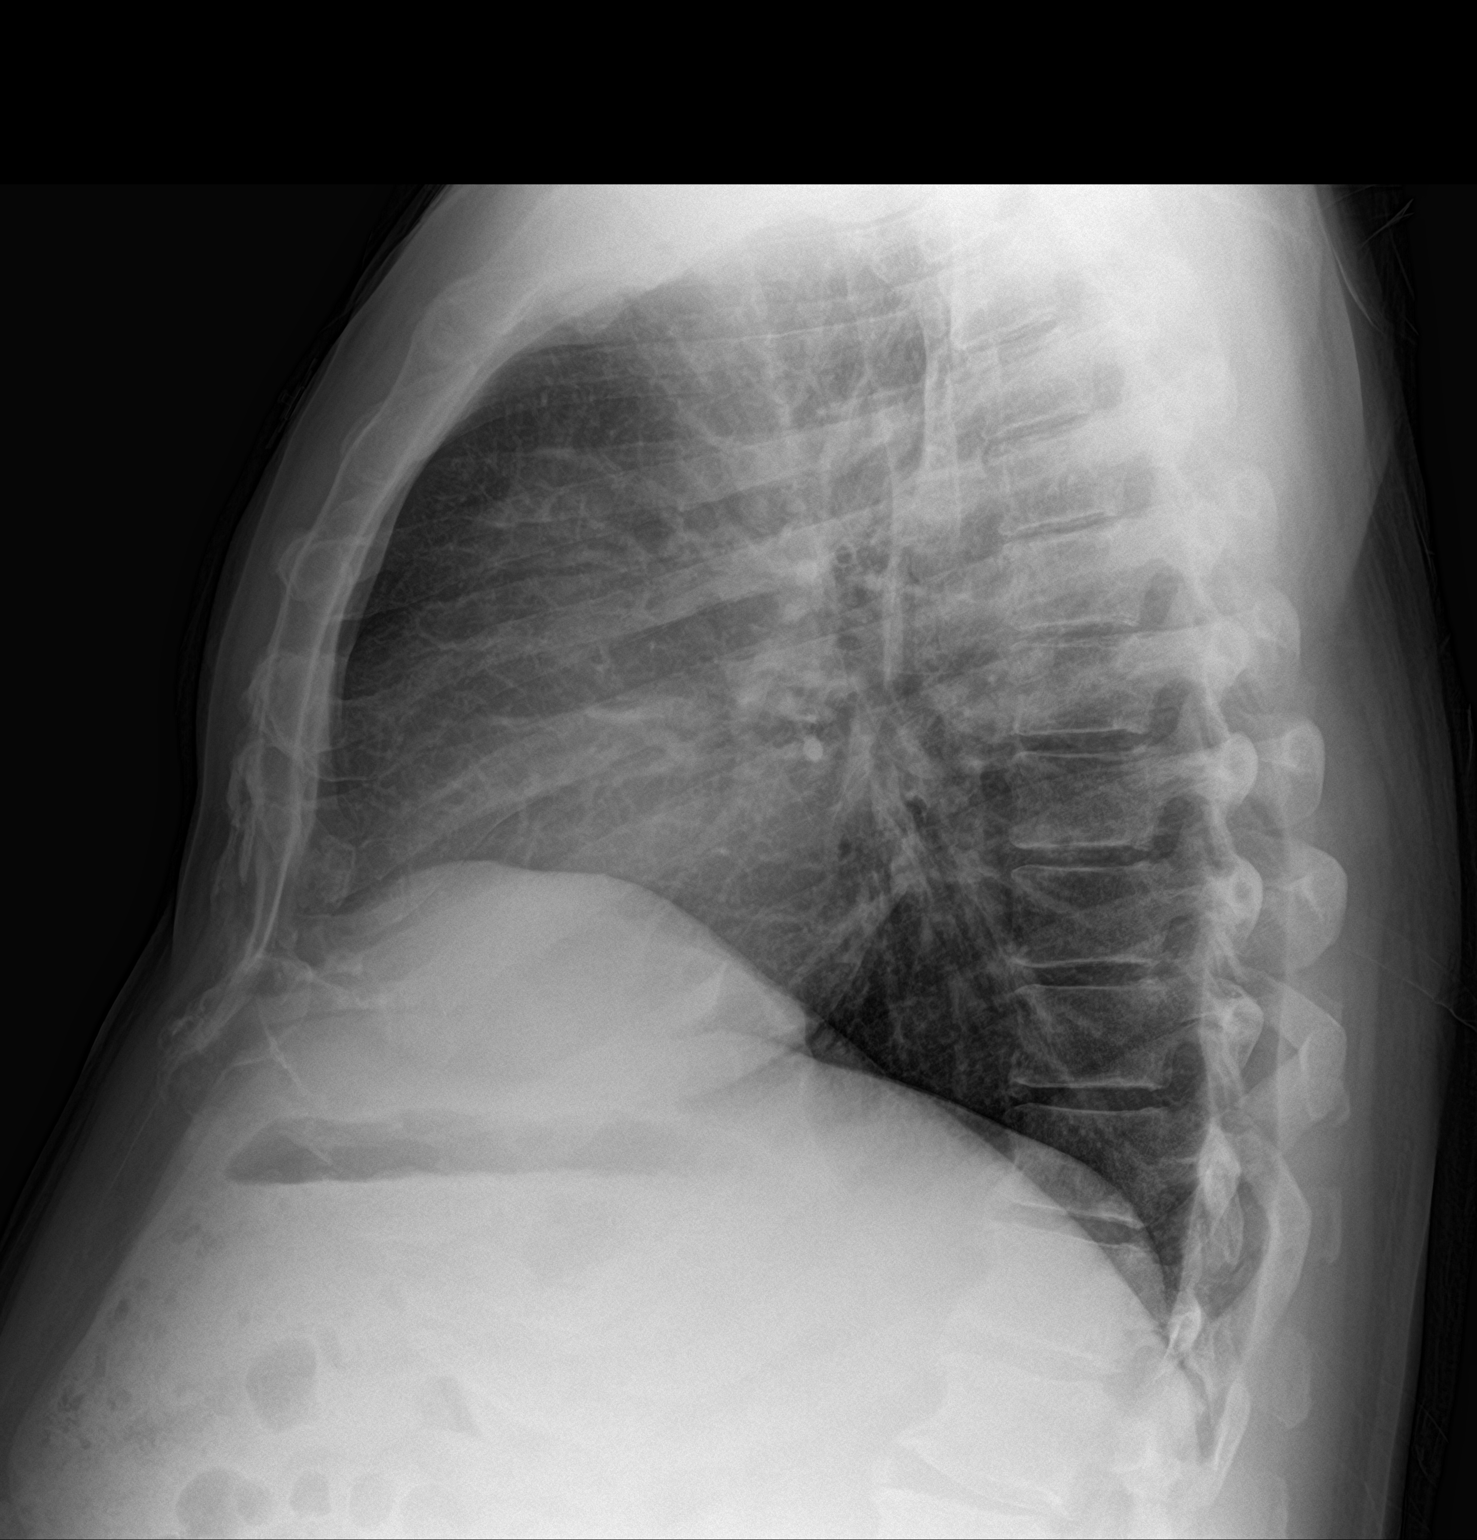

[2 of 2 positions shown; findings below may reference images not displayed]

FINDINGS: The heart size and mediastinal contours are within normal limits.
Both lungs are clear. No visible pleural effusions or pneumothorax.
No acute osseous abnormality. Mild elevation of the right
hemidiaphragm.
IMPRESSION: No active cardiopulmonary disease.

## 2023-08-21 IMAGING — CR DG CHEST 2V
1 series · 2 of 2 positions shown · non-contrast
Comparison: 01/20/2022

CLINICAL DATA: Mid upper back pain radiating to the right chest
associated with dizziness and lightheadedness.

EXAM:
CHEST - 2 VIEW

[Series 1: dg chest 2 view · 0.14mm/px · 2 of 2 slices shown]
[im 1/2]
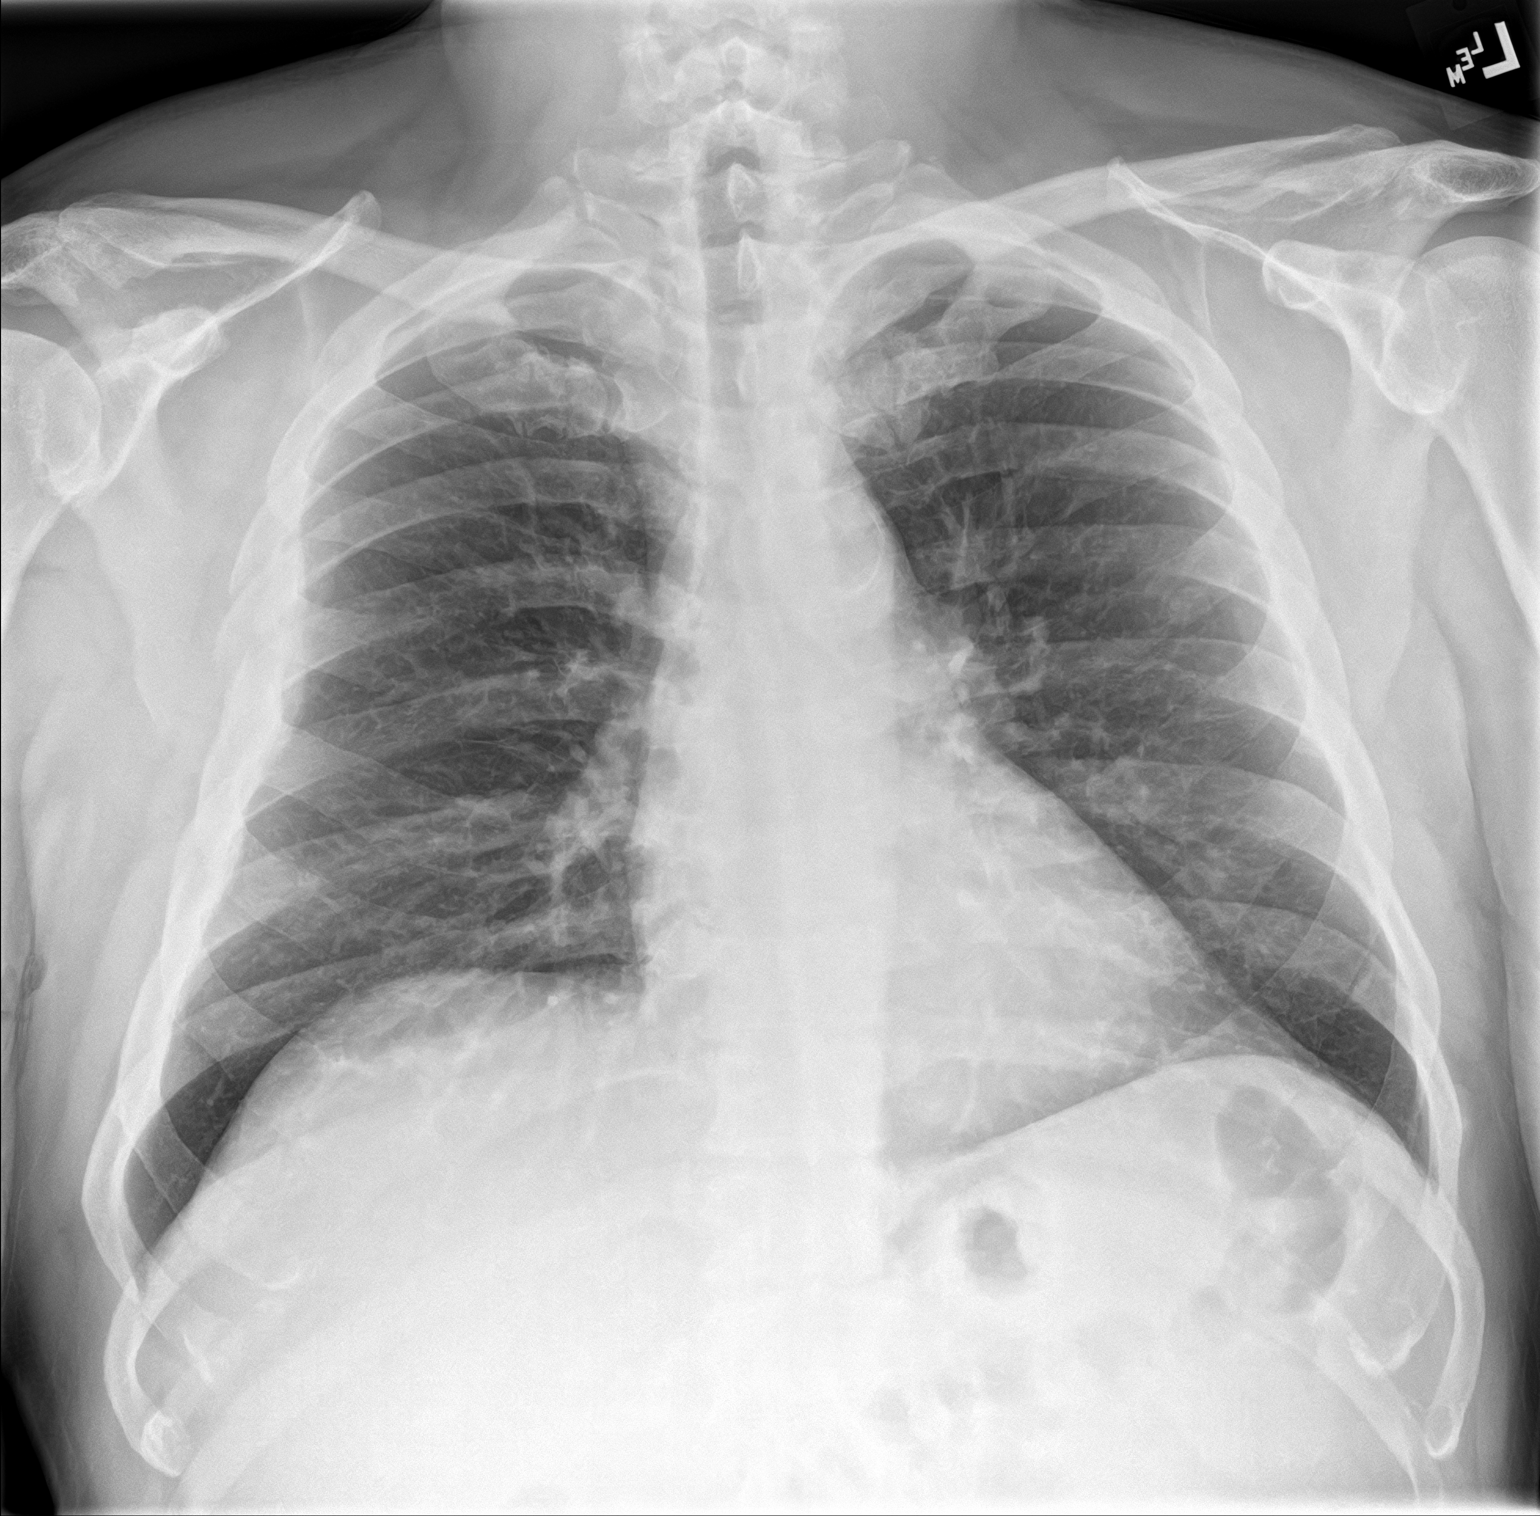
[im 2/2]
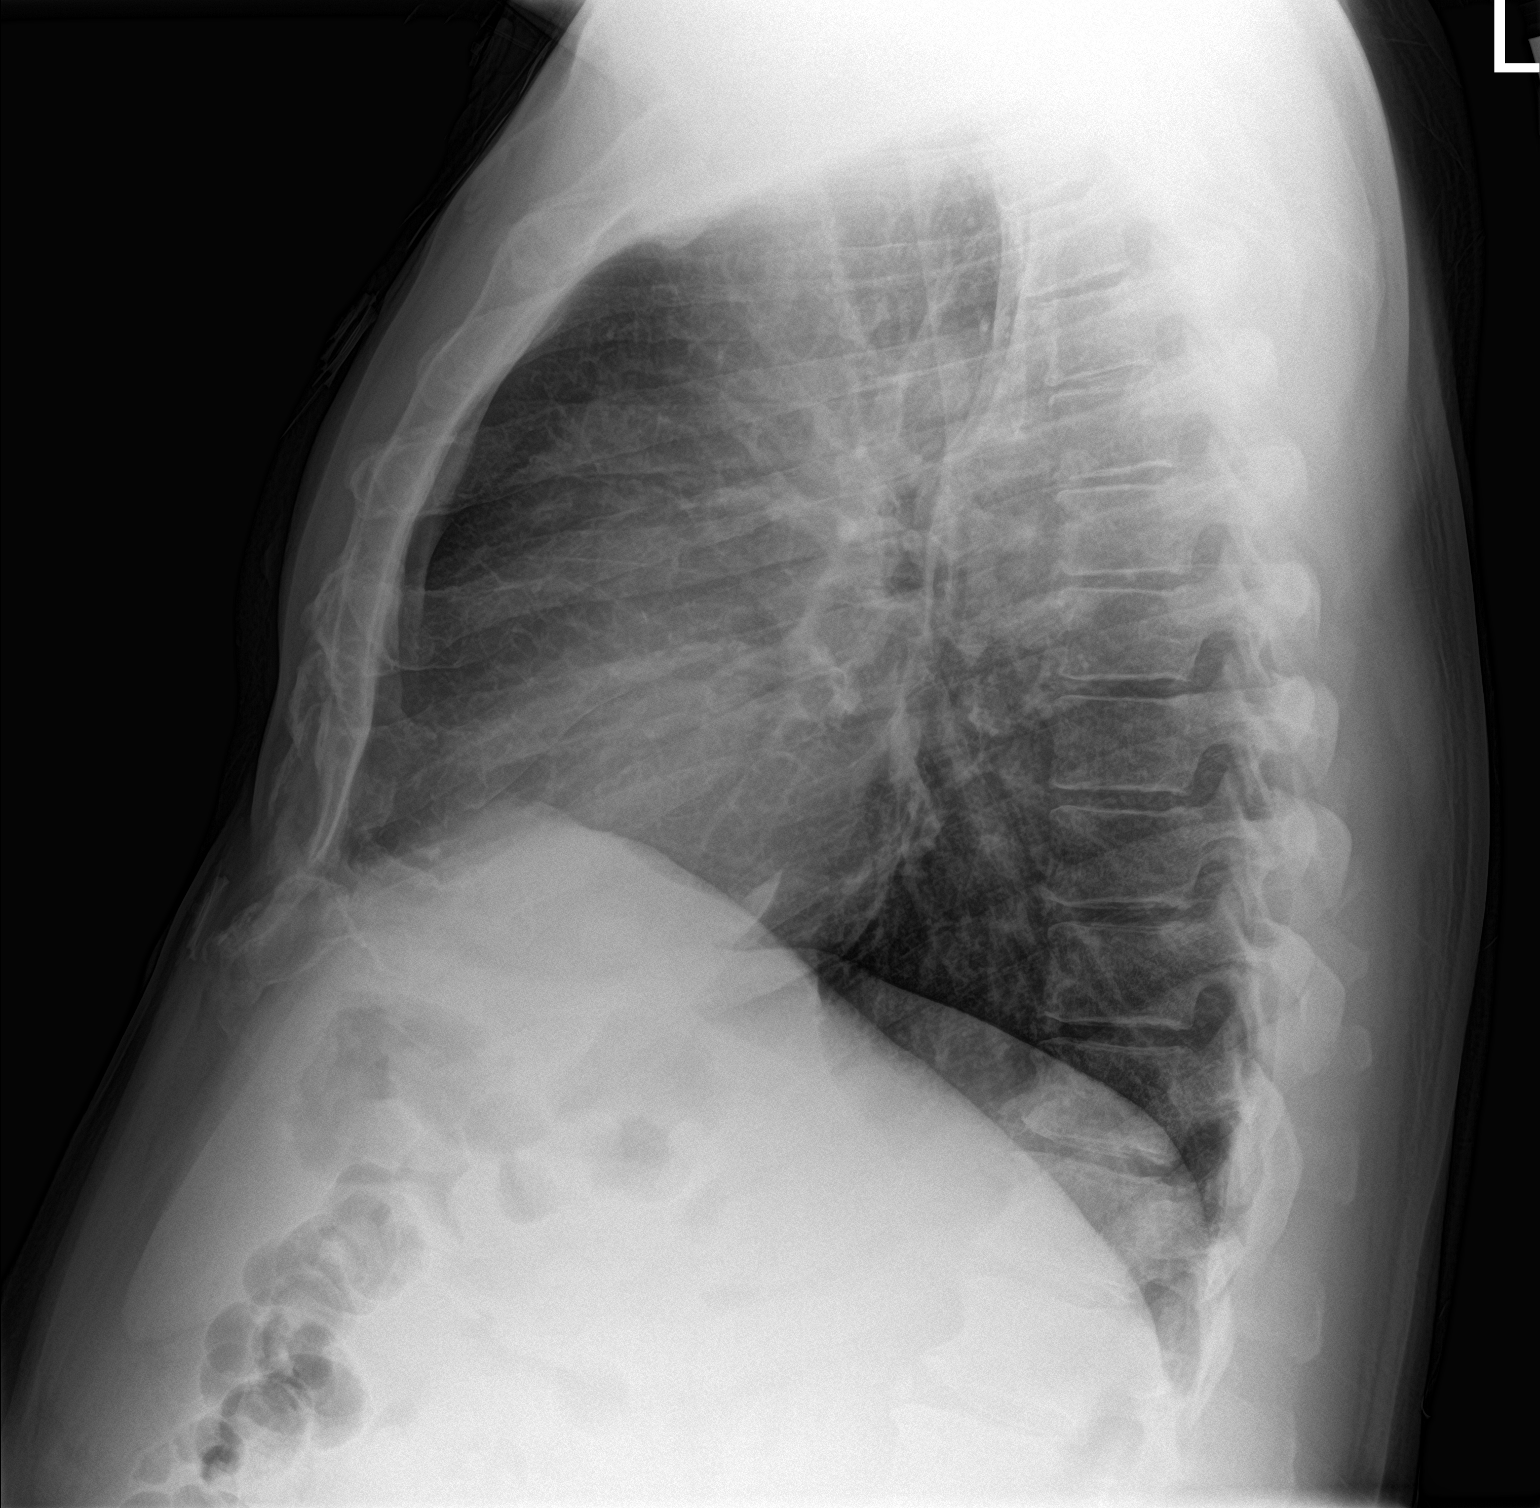

[2 of 2 positions shown; findings below may reference images not displayed]

FINDINGS: Normal heart, mediastinum and hila.

Clear lungs.  No pleural effusion or pneumothorax.

Skeletal structures are intact.
IMPRESSION: No active cardiopulmonary disease.

## 2023-09-02 ENCOUNTER — Other Ambulatory Visit: Payer: Self-pay | Admitting: Family Medicine

## 2023-09-02 DIAGNOSIS — Z5181 Encounter for therapeutic drug level monitoring: Secondary | ICD-10-CM

## 2023-09-02 DIAGNOSIS — E1165 Type 2 diabetes mellitus with hyperglycemia: Secondary | ICD-10-CM

## 2023-09-04 ENCOUNTER — Ambulatory Visit: Payer: BC Managed Care – PPO | Admitting: Internal Medicine

## 2023-10-21 ENCOUNTER — Other Ambulatory Visit: Payer: Self-pay | Admitting: Internal Medicine

## 2023-10-21 DIAGNOSIS — I10 Essential (primary) hypertension: Secondary | ICD-10-CM

## 2023-10-23 NOTE — Telephone Encounter (Signed)
Requested Prescriptions  Pending Prescriptions Disp Refills   lisinopril (ZESTRIL) 10 MG tablet [Pharmacy Med Name: Lisinopril 10 MG Oral Tablet] 90 tablet 0    Sig: Take 1 tablet by mouth once daily     Cardiovascular:  ACE Inhibitors Failed - 10/21/2023  7:53 AM      Failed - Last BP in normal range    BP Readings from Last 1 Encounters:  06/04/23 (!) 156/84         Passed - Cr in normal range and within 180 days    Creat  Date Value Ref Range Status  06/04/2023 0.86 0.70 - 1.30 mg/dL Final   Creatinine, Urine  Date Value Ref Range Status  06/04/2023 202 20 - 320 mg/dL Final         Passed - K in normal range and within 180 days    Potassium  Date Value Ref Range Status  06/04/2023 5.1 3.5 - 5.3 mmol/L Final         Passed - Patient is not pregnant      Passed - Valid encounter within last 6 months    Recent Outpatient Visits           4 months ago Hypertension, unspecified type   [facility] Danelle Berry, PA-C   5 months ago Hypertension, unspecified type   [facility] Margarita Mail, DO   9 months ago Left lower quadrant abdominal pain   [facility] Margarita Mail, DO   12 months ago Type 2 diabetes mellitus with hyperglycemia, without long-term current use of insulin [facility])   [facility] Margarita Mail, DO   1 year ago Hypertension, unspecified type   [facility] Margarita Mail, [place]

## 2023-10-24 ENCOUNTER — Other Ambulatory Visit: Payer: Self-pay | Admitting: Family Medicine

## 2023-10-24 DIAGNOSIS — B0229 Other postherpetic nervous system involvement: Secondary | ICD-10-CM

## 2024-01-17 ENCOUNTER — Emergency Department
Admission: EM | Admit: 2024-01-17 | Discharge: 2024-01-17 | Disposition: A | Discharge status: Home / Self Care | Payer: Self-pay | Attending: Emergency Medicine | Admitting: Emergency Medicine

## 2024-01-17 ENCOUNTER — Other Ambulatory Visit: Payer: Self-pay

## 2024-01-17 ENCOUNTER — Emergency Department: Payer: Self-pay

## 2024-01-17 DIAGNOSIS — S82401S Unspecified fracture of shaft of right fibula, sequela: Secondary | ICD-10-CM

## 2024-01-17 DIAGNOSIS — W002XXA Other fall from one level to another due to ice and snow, initial encounter: Secondary | ICD-10-CM | POA: Insufficient documentation

## 2024-01-17 DIAGNOSIS — S82434A Nondisplaced oblique fracture of shaft of right fibula, initial encounter for closed fracture: Secondary | ICD-10-CM | POA: Insufficient documentation

## 2024-01-17 DIAGNOSIS — Y92009 Unspecified place in unspecified non-institutional (private) residence as the place of occurrence of the external cause: Secondary | ICD-10-CM | POA: Insufficient documentation

## 2024-01-17 MED ORDER — IBUPROFEN 600 MG PO TABS: 600.0000 mg | ORAL_TABLET | Freq: Three times a day (TID) | ORAL | 0 refills | Status: AC | PRN

## 2024-01-17 MED ORDER — OXYCODONE-ACETAMINOPHEN 5-325 MG PO TABS
1.0000 | ORAL_TABLET | ORAL | Status: DC | PRN
  Administered 2024-01-17: 1 via ORAL
  Filled 2024-01-17: qty 1

## 2024-01-17 MED ORDER — IBUPROFEN 800 MG PO TABS
800.0000 mg | ORAL_TABLET | Freq: Once | ORAL | Status: AC
  Administered 2024-01-17: 800 mg via ORAL
  Filled 2024-01-17: qty 1

## 2024-01-17 NOTE — ED Provider Triage Note (Signed)
Emergency Medicine Provider Triage Evaluation Note  Shammond Arave , a 58 y.o. male  was evaluated in triage.  Pt complains of right ankle pain after inversion injury earlier this morning.  Ankle is swollen.  He also has some tenderness in the right knee.  Previous right ankle fracture with out indwelling hardware.  Physical Exam  BP (!) 178/82 (BP Location: Left Arm)   Pulse 79   Temp 98.4 F (36.9 C) (Oral)   Resp 15   Ht 5\' 9"  (1.753 m)   Wt 68 kg   SpO2 96%   BMI 22.15 kg/m  Gen:   Awake, no distress   Resp:  Normal effort  MSK:   Moves extremities without difficulty  Other:  Pulse, motor function, and sensation is all intact in the right lower extremity.  Medical Decision Making  Medically screening exam initiated at 2:01 PM.  Appropriate orders placed.  Trevor Iha was informed that the remainder of the evaluation will be completed by another provider, this initial triage assessment does not replace that evaluation, and the importance of remaining in the ED until their evaluation is complete.  Imaging of the right tib-fib and ankle ordered.   Chinita Pester, FNP 01/17/24 1404

## 2024-01-17 NOTE — ED Triage Notes (Signed)
Patient presents with swelling to the right ankle and pain in the right foot. Patient slipped and fell on the ice yesterday at home. Did not hit his head, No LOC. No blood thinners. Rates pain 9/10

## 2024-01-17 NOTE — Discharge Instructions (Addendum)
You have been diagnosed with right fibular fracture.  Please take ibuprofen every 8 hours after main meals to decrease pain and swelling.  Please call [facility] orthopedics and make an appointment for a follow-up next week.  Please keep your leg elevated.

## 2024-01-17 NOTE — ED Provider Notes (Signed)
[facility] Provider Note    Event Date/Time   First MD Initiated Contact with Patient 01/17/24 1522     (approximate)   History   Ankle Pain   HPI  Colin Brooks is a 58 y.o. male who presents today with history of fall last night in the ice.  Patient states hearing a pop in his right ankle.  Patient states pain increased while walking.     Physical Exam   Triage Vital Signs: ED Triage Vitals  Encounter Vitals Group     BP 01/17/24 1358 (!) 178/82     Systolic BP Percentile --      Diastolic BP Percentile --      Pulse Rate 01/17/24 1358 79     Resp 01/17/24 1358 15     Temp 01/17/24 1358 98.4 F (36.9 C)     Temp Source 01/17/24 1358 Oral     SpO2 01/17/24 1358 96 %     Weight 01/17/24 1359 150 lb (68 kg)     Height 01/17/24 1359 5\' 9"  (1.753 m)     Head Circumference --      Peak Flow --      Pain Score 01/17/24 1359 9     Pain Loc --      Pain Education --      Exclude from Growth Chart --     Most recent vital signs: Vitals:   01/17/24 1358  BP: (!) 178/82  Pulse: 79  Resp: 15  Temp: 98.4 F (36.9 C)  SpO2: 96%     Constitutional: Alert nondistressed Eyes: Conjunctivae are normal.  Head: Atraumatic. Nose: No congestion/rhinnorhea. Mouth/Throat: Mucous membranes are moist.   Neck: Painless ROM.  Cardiovascular:   Good peripheral circulation. Respiratory: Normal respiratory effort.  No retractions.  Gastrointestinal: Soft and nontender.  Musculoskeletal:  no deformity Right lower extremity: Skin is intact, presence of hematoma in the lateral area of the right foot.  Ankle edema. pulses are present, sensation is intact, strength 4/5.  Full ROM limited by pain and edema. Neurologic:  MAE spontaneously. No gross focal neurologic deficits are appreciated.  Skin:  Skin is warm, dry and intact. No rash noted. Psychiatric: Mood and affect are normal. Speech and behavior are normal.    ED Results / Procedures / Treatments    Labs (all labs ordered are listed, but only abnormal results are displayed) Labs Reviewed - No data to display   EKG     RADIOLOGY I independently reviewed and interpreted imaging and agree with radiologists findings.      PROCEDURES:  Critical Care performed:   Procedures   MEDICATIONS ORDERED IN ED: Medications  oxyCODONE-acetaminophen (PERCOCET/ROXICET) 5-325 MG per tablet 1 tablet (1 tablet Oral Given 01/17/24 1403)  ibuprofen (ADVIL) tablet 800 mg (800 mg Oral Given 01/17/24 1547)     IMPRESSION / MDM / ASSESSMENT AND PLAN / ED COURSE  I reviewed the triage vital signs and the nursing notes.  Differential diagnosis includes, but is not limited to, fibular fracture, muscle sprain, Achilles tendon injury,  Patient's presentation is most consistent with acute complicated illness / injury requiring diagnostic workup.   Patient's diagnosis is consistent with right fibular nondisplaced oblique fracture. I independently reviewed and interpreted imaging and agree with radiologists findings. I did review the patient's allergies and medications. Patient will be discharged home with prescriptions for ibuprofen 800 mg, and cam boot.  Patient is to follow up with orthopedics as needed or otherwise  directed. Patient is given ED precautions to return to the ED for any worsening or new symptoms. Discussed plan of care with patient, answered all of patient's questions, Patient agreeable to plan of care. Advised patient to take medications according to the instructions on the label. Discussed possible side effects of new medications. Patient verbalized understanding. Clinical Course as of 01/17/24 1559  Thu Jan 17, 2024  1525 DG Ankle Complete Right Nondisplaced oblique fracture of the distal fibular diaphysis at the level of the syndesmosis.   [AE]    Clinical Course User Index [AE] Gladys Damme, PA-C     FINAL CLINICAL IMPRESSION(S) / ED DIAGNOSES   Final diagnoses:   None     Rx / DC Orders   ED Discharge Orders          Ordered    ibuprofen (ADVIL) 600 MG tablet  Every 8 hours PRN        01/17/24 1557             Note:  This document was prepared using Dragon voice recognition software and may include unintentional dictation errors.   Gladys Damme, PA-C 01/17/24 1559    Minna Antis, MD 01/17/24 2055

## 2024-01-23 ENCOUNTER — Other Ambulatory Visit: Payer: Self-pay | Admitting: Internal Medicine

## 2024-01-23 DIAGNOSIS — I10 Essential (primary) hypertension: Secondary | ICD-10-CM

## 2024-01-23 NOTE — Telephone Encounter (Signed)
 Patient called, left VM to return the call to the office to schedule a visit with provider. Will give courtesy refill due to appointment needed.  Requested Prescriptions  Pending Prescriptions Disp Refills   lisinopril (ZESTRIL) 10 MG tablet [Pharmacy Med Name: Lisinopril 10 MG Oral Tablet] 90 tablet 0    Sig: Take 1 tablet by mouth once daily     Cardiovascular:  ACE Inhibitors Failed - 01/23/2024  4:58 PM      Failed - Cr in normal range and within 180 days    Creat  Date Value Ref Range Status  06/04/2023 0.86 0.70 - 1.30 mg/dL Final   Creatinine, Urine  Date Value Ref Range Status  06/04/2023 202 20 - 320 mg/dL Final         Failed - K in normal range and within 180 days    Potassium  Date Value Ref Range Status  06/04/2023 5.1 3.5 - 5.3 mmol/L Final         Failed - Last BP in normal range    BP Readings from Last 1 Encounters:  01/17/24 (!) 178/82         Failed - Valid encounter within last 6 months    Recent Outpatient Visits           7 months ago Hypertension, unspecified type   [facility] Danelle Berry, PA-C   8 months ago Hypertension, unspecified type   [facility] Margarita Mail, DO   1 year ago Left lower quadrant abdominal pain   [facility] Margarita Mail, DO   1 year ago Type 2 diabetes mellitus with hyperglycemia, without long-term current use of insulin [facility])   [facility] Margarita Mail, DO   1 year ago Hypertension, unspecified type   [facility] Margarita Mail, [place]              Passed - Patient is not pregnant

## 2024-02-22 ENCOUNTER — Other Ambulatory Visit: Payer: Self-pay | Admitting: Internal Medicine

## 2024-02-22 DIAGNOSIS — I10 Essential (primary) hypertension: Secondary | ICD-10-CM

## 2024-02-25 NOTE — Telephone Encounter (Signed)
 Requested medication (s) are due for refill today: yes  Requested medication (s) are on the active medication list: yes  Last refill:  01/23/24 #30  Future visit scheduled: no  Notes to clinic:  called pt and LM on VM to call back. Last refill was a courtesy refill   Requested Prescriptions  Pending Prescriptions Disp Refills   lisinopril (ZESTRIL) 10 MG tablet [Pharmacy Med Name: Lisinopril 10 MG Oral Tablet] 30 tablet 0    Sig: TAKE 1 TABLET BY MOUTH ONCE DAILY.  OFFICE VISIT NEEDED FOR ADDITIONAL REFILLS     Cardiovascular:  ACE Inhibitors Failed - 02/25/2024 12:30 PM      Failed - Cr in normal range and within 180 days    Creat  Date Value Ref Range Status  06/04/2023 0.86 0.70 - 1.30 mg/dL Final   Creatinine, Urine  Date Value Ref Range Status  06/04/2023 202 20 - 320 mg/dL Final         Failed - K in normal range and within 180 days    Potassium  Date Value Ref Range Status  06/04/2023 5.1 3.5 - 5.3 mmol/L Final         Failed - Last BP in normal range    BP Readings from Last 1 Encounters:  01/17/24 (!) 178/82         Failed - Valid encounter within last 6 months    Recent Outpatient Visits   None            Passed - Patient is not pregnant

## 2024-05-19 ENCOUNTER — Other Ambulatory Visit (HOSPITAL_COMMUNITY): Payer: Self-pay
